# Patient Record
Sex: Female | Born: 1975 | Race: White | Hispanic: No | Marital: Married | Smoking: Never smoker
Health system: Southern US, Community
[De-identification: ages and names within clinical notes are randomized; demographics above are authoritative.]

## PROBLEM LIST (undated history)

## (undated) DIAGNOSIS — E611 Iron deficiency: Secondary | ICD-10-CM

## (undated) DIAGNOSIS — N920 Excessive and frequent menstruation with regular cycle: Secondary | ICD-10-CM

## (undated) DIAGNOSIS — R030 Elevated blood-pressure reading, without diagnosis of hypertension: Secondary | ICD-10-CM

## (undated) DIAGNOSIS — G4730 Sleep apnea, unspecified: Secondary | ICD-10-CM

## (undated) HISTORY — PX: TONSILLECTOMY: SHX5217

---

## 2003-06-16 ENCOUNTER — Other Ambulatory Visit: Admission: RE | Admit: 2003-06-16 | Discharge: 2003-06-16 | Payer: Self-pay | Admitting: Obstetrics and Gynecology

## 2003-11-24 ENCOUNTER — Other Ambulatory Visit: Admission: RE | Admit: 2003-11-24 | Discharge: 2003-11-24 | Payer: Self-pay | Admitting: Obstetrics and Gynecology

## 2004-09-04 ENCOUNTER — Emergency Department (HOSPITAL_COMMUNITY): Admission: EM | Admit: 2004-09-04 | Discharge: 2004-09-05 | Payer: Self-pay | Admitting: Emergency Medicine

## 2004-11-19 ENCOUNTER — Ambulatory Visit: Payer: Self-pay | Admitting: Family Medicine

## 2005-02-15 ENCOUNTER — Other Ambulatory Visit: Admission: RE | Admit: 2005-02-15 | Discharge: 2005-02-15 | Payer: Self-pay | Admitting: Obstetrics and Gynecology

## 2005-11-05 ENCOUNTER — Inpatient Hospital Stay (HOSPITAL_COMMUNITY): Admission: AD | Admit: 2005-11-05 | Discharge: 2005-11-08 | Payer: Self-pay | Admitting: Obstetrics and Gynecology

## 2005-12-12 ENCOUNTER — Other Ambulatory Visit: Admission: RE | Admit: 2005-12-12 | Discharge: 2005-12-12 | Payer: Self-pay | Admitting: Obstetrics and Gynecology

## 2007-07-08 ENCOUNTER — Encounter: Payer: Self-pay | Admitting: Family Medicine

## 2008-07-20 ENCOUNTER — Encounter: Payer: Self-pay | Admitting: Family Medicine

## 2008-07-21 LAB — HM PAP SMEAR

## 2008-08-25 ENCOUNTER — Ambulatory Visit: Payer: Self-pay | Admitting: Family Medicine

## 2008-08-26 LAB — CONVERTED CEMR LAB
CO2: 23 meq/L (ref 19–32)
Creatinine, Ser: 0.76 mg/dL (ref 0.40–1.20)
Glucose, Bld: 97 mg/dL (ref 70–99)
LDL Cholesterol: 99 mg/dL (ref 0–99)
Sodium: 138 meq/L (ref 135–145)
TSH: 3.919 microintl units/mL (ref 0.350–4.50)
Total CHOL/HDL Ratio: 3.6
Total Protein: 7.5 g/dL (ref 6.0–8.3)
Triglycerides: 80 mg/dL (ref <150)
VLDL: 16 mg/dL (ref 0–40)

## 2008-10-26 ENCOUNTER — Ambulatory Visit: Payer: Self-pay | Admitting: Family Medicine

## 2008-10-26 ENCOUNTER — Encounter: Admission: RE | Admit: 2008-10-26 | Discharge: 2008-10-26 | Payer: Self-pay | Admitting: Family Medicine

## 2008-11-14 ENCOUNTER — Telehealth: Payer: Self-pay | Admitting: Family Medicine

## 2008-11-14 ENCOUNTER — Ambulatory Visit: Payer: Self-pay | Admitting: Family Medicine

## 2008-11-18 ENCOUNTER — Encounter: Admission: RE | Admit: 2008-11-18 | Discharge: 2008-11-18 | Payer: Self-pay | Admitting: Family Medicine

## 2008-11-18 ENCOUNTER — Telehealth: Payer: Self-pay | Admitting: Family Medicine

## 2008-11-22 ENCOUNTER — Telehealth: Payer: Self-pay | Admitting: Family Medicine

## 2008-11-23 ENCOUNTER — Encounter: Payer: Self-pay | Admitting: Family Medicine

## 2008-11-28 ENCOUNTER — Ambulatory Visit: Payer: Self-pay | Admitting: Family Medicine

## 2008-11-28 DIAGNOSIS — J45990 Exercise induced bronchospasm: Secondary | ICD-10-CM

## 2009-04-26 ENCOUNTER — Telehealth: Payer: Self-pay | Admitting: Family Medicine

## 2009-07-28 ENCOUNTER — Encounter: Admission: RE | Admit: 2009-07-28 | Discharge: 2009-07-28 | Payer: Self-pay | Admitting: Obstetrics and Gynecology

## 2009-09-08 ENCOUNTER — Ambulatory Visit: Payer: Self-pay | Admitting: Family Medicine

## 2009-10-06 ENCOUNTER — Ambulatory Visit: Payer: Self-pay | Admitting: Family Medicine

## 2009-10-06 DIAGNOSIS — R635 Abnormal weight gain: Secondary | ICD-10-CM | POA: Insufficient documentation

## 2009-10-06 DIAGNOSIS — N920 Excessive and frequent menstruation with regular cycle: Secondary | ICD-10-CM

## 2009-10-10 LAB — CONVERTED CEMR LAB
Basophils Absolute: 0 K/uL
Basophils Relative: 0 %
Eosinophils Absolute: 0.1 K/uL
Eosinophils Relative: 2 %
Ferritin: 32 ng/mL
Free T4: 1.02 ng/dL
HCT: 40.4 %
Hemoglobin: 13.6 g/dL
Iron: 79 ug/dL
Lymphocytes Relative: 33 %
Lymphs Abs: 2.3 K/uL
MCHC: 33.7 g/dL
MCV: 90.8 fL
Monocytes Absolute: 0.6 K/uL
Monocytes Relative: 9 %
Neutro Abs: 4 K/uL
Neutrophils Relative %: 57 %
Platelets: 279 K/uL
RBC: 4.45 M/uL
RDW: 13.2 %
Saturation Ratios: 18 % — ABNORMAL LOW
T3, Free: 3.1 pg/mL
TIBC: 434 ug/dL
TSH: 1.371 u[IU]/mL
UIBC: 355 ug/dL
WBC: 7.1 10*3/microliter

## 2010-01-08 ENCOUNTER — Ambulatory Visit: Payer: Self-pay | Admitting: Family Medicine

## 2010-01-08 DIAGNOSIS — J00 Acute nasopharyngitis [common cold]: Secondary | ICD-10-CM | POA: Insufficient documentation

## 2010-01-08 LAB — CONVERTED CEMR LAB: Rapid Strep: NEGATIVE

## 2010-01-22 ENCOUNTER — Ambulatory Visit: Payer: Self-pay | Admitting: Family Medicine

## 2010-01-22 DIAGNOSIS — L738 Other specified follicular disorders: Secondary | ICD-10-CM

## 2010-11-20 NOTE — Assessment & Plan Note (Signed)
Summary: folliculitis   Vital Signs:  Patient profile:   35 year old female Height:      64 inches Weight:      221 pounds Pulse rate:   90 / minute BP sitting:   123 / 77  (left arm) Cuff size:   large  Vitals Entered By: Kathlene November (January 22, 2010 2:58 PM) CC: C-section scar to the right has knot did notice some pus drainage from site, Hypertension Management   Primary Care Provider:  Nani Gasser MD  CC:  C-section scar to the right has knot did notice some pus drainage from site and Hypertension Management.  History of Present Illness: C-section scar to the right has knot did notice some pus drainage from site. Noticed it 5 night ago and noticed a tender spot when got of of the shower. Noticed some yellow pus.  c/section scar from 10 years ago.  It is less swollen today.   Hypertension History:      Negative major cardiovascular risk factors include female age less than 52 years old, negative family history for ischemic heart disease, and non-tobacco-user status.     Current Medications (verified): 1)  Ventolin Hfa 108 (90 Base) Mcg/act Aers (Albuterol Sulfate) .... 2-4 Puffs Inhaled Every 4-6 Hours As Needed  Allergies (verified): No Known Drug Allergies  Comments:  Nurse/Medical Assistant: The patient's medications and allergies were reviewed with the patient and were updated in the Medication and Allergy Lists. Kathlene November (January 22, 2010 2:59 PM)  Physical Exam  General:  Well-developed,well-nourished,in no acute distress; alert,appropriate and cooperative throughout examination Skin:  Right above the right lateral edge of the c/sec scar ther is a pustule with a hair at the center. It is not particularly inflammed but there is some induration under the skin.    Impression & Recommendations:  Problem # 1:  FOLLICULITIS (ICD-704.8) Use antibacterial soap and keep clean. Warm compresses. Take ABS as rx.  If not resolving in one week then let me know and  will need to I & D the lesion.   Complete Medication List: 1)  Ventolin Hfa 108 (90 Base) Mcg/act Aers (Albuterol sulfate) .... 2-4 puffs inhaled every 4-6 hours as needed 2)  Sulfamethoxazole-tmp Ds 800-160 Mg Tabs (Sulfamethoxazole-trimethoprim) .... Take 1 tablet by mouth two times a day for 10 days  Hypertension Assessment/Plan:      The patient's hypertensive risk group is category A: No risk factors and no target organ damage.  Her calculated 10 year risk of coronary heart disease is 1 %.  Today's blood pressure is 123/77.   Prescriptions: SULFAMETHOXAZOLE-TMP DS 800-160 MG TABS (SULFAMETHOXAZOLE-TRIMETHOPRIM) Take 1 tablet by mouth two times a day for 10 days  #20 x 0   Entered and Authorized by:   Nani Gasser MD   Signed by:   Nani Gasser MD on 01/22/2010   Method used:   Electronically to        [facility] [phone]* (retail)       [address]       [place], [place]  [address]       Ph: [phone] or [phone]       Fax: [phone]   RxID:   [id number]

## 2010-11-20 NOTE — Assessment & Plan Note (Signed)
Summary: Pharyngitis   Vital Signs:  Patient profile:   35 year old female Height:      64 inches Weight:      214 pounds O2 Sat:      98 % on Room air Temp:     98.4 degrees F oral Pulse rate:   79 / minute BP sitting:   129 / 83  (left arm) Cuff size:   large  Vitals Entered By: Kathlene November (January 08, 2010 12:56 PM)  O2 Flow:  Room air CC: pain in throat since Friday- today started a cough and runny nose   Primary Care Provider:  Nani Gasser MD  CC:  pain in throat since Friday- today started a cough and runny nose.  History of Present Illness: Jordan Hickman is a 35 year old female presenting with sore throat, cough, and runny nose/congestion x4 days. On Friday afternoon she began having a sore throat which has continued on and off since then, worse in the mornings. Today she has had dry cough and runny nose/nasal congestion. No fevers, chills, sweats. Sick contacts include her daughter who had a recent stomach bug. Pt had diarrhea for a week and a half, which she says she always has with her period, which resolved yesterday. She had nausea but no vomiting. She has not tried any OTC medications.   Current Medications (verified): 1)  Ventolin Hfa 108 (90 Base) Mcg/act Aers (Albuterol Sulfate) .... 2-4 Puffs Inhaled Every 4-6 Hours As Needed  Allergies (verified): No Known Drug Allergies  Comments:  Nurse/Medical Assistant: The patient's medications and allergies were reviewed with the patient and were updated in the Medication and Allergy Lists. Kathlene November (January 08, 2010 12:56 PM)  Physical Exam  General:  Pleasant overweight-appearing female in no acute distress.  Head:  Normocephalic and atraumatic.  Eyes:  Sclera clear with no corneal or conjunctival inflammation noted. Ears:  External ear exam shows no significant lesions or deformities.  Otoscopic examination reveals clear canals, tympanic membranes are intact bilaterally without bulging, retraction,  inflammation or discharge. Hearing is grossly normal bilaterally. Nose:  External nasal examination shows no deformity or inflammation.  Mouth:  Oropharynx injected with no exudate. Moist mucus membranes.  Neck:  Right anterior cervical lymph node  ~1cm in diameter. Nontender. Neck supple.  Lungs:  Normal respiratory effort, chest expands symmetrically. Lungs are clear to auscultation, no crackles or wheezes. Heart:  Normal rate and regular rhythm. S1 and S2 normal without gallop, murmur, click, rub or other extra sounds. Pulses:  2+ radial pulses bilaterally.  Skin:  no rashes.   Psych:  Interacting appropriately with normal concentration and attention.    Impression & Recommendations:  Problem # 1:  ACUTE NASOPHARYNGITIS (ICD-460)  Acute viral illness. Rapid strep negative. Suggested OTC strategies for symptomatic management including saltwater gargle. Follow up if symptoms worsen over next 7-10 days.   Orders: Rapid Strep ([address])  Complete Medication List: 1)  Ventolin Hfa 108 (90 Base) Mcg/act Aers (Albuterol sulfate) .... 2-4 puffs inhaled every 4-6 hours as needed  Laboratory Results  Date/Time Received: 01/08/2010 Date/Time Reported: 01/08/2010  Other Tests  Rapid Strep: negative

## 2011-02-18 ENCOUNTER — Ambulatory Visit (INDEPENDENT_AMBULATORY_CARE_PROVIDER_SITE_OTHER): Payer: 59 | Admitting: Family Medicine

## 2011-02-18 ENCOUNTER — Encounter: Payer: Self-pay | Admitting: Family Medicine

## 2011-02-18 VITALS — BP 138/85 | HR 85 | Ht 64.0 in | Wt 229.0 lb

## 2011-02-18 DIAGNOSIS — M79673 Pain in unspecified foot: Secondary | ICD-10-CM

## 2011-02-18 DIAGNOSIS — M79609 Pain in unspecified limb: Secondary | ICD-10-CM

## 2011-02-18 DIAGNOSIS — M7989 Other specified soft tissue disorders: Secondary | ICD-10-CM

## 2011-02-18 MED ORDER — CYCLOBENZAPRINE HCL 10 MG PO TABS: 10.0000 mg | ORAL_TABLET | Freq: Every evening | ORAL | Status: AC | PRN

## 2011-02-18 NOTE — Progress Notes (Signed)
  Subjective:    Patient ID: Jordan Hickman, female    DOB: [birth year], 34 y.o.   MRN: [id number]  HPI Heels have been red for about a week. Last night was getting cramping in her left heel almost every hour. She barely slept last night. Tried massaging the area nd her foot. The spasm would last for about 2 minutes.  Feel have been swollen.  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  Heels have been sore to stand on them for about the last 2 weeks.     Review of Systems     Objective:   Physical Exam  Constitutional: She appears well-developed and well-nourished.  Musculoskeletal:       Bilat heels pad are erythematous and swollen. No breaks in the skin. Some dry thickened skin. DP pulse 2+ bilat.            Assessment & Plan:  Heel fat pad contusions - Will refer to podiatry. Rest for a couple of days. In the meantime trial of NSAID for pain relief.  Will check potassium level to make sure potassium is normal. Trial of muscle relaxer at bedtime.

## 2011-02-18 NOTE — Patient Instructions (Signed)
We will call you with the Podiatry referral.  Try aleve or Ibuprofen for one week.  We will call you with the lab results.

## 2011-02-19 ENCOUNTER — Telehealth: Payer: Self-pay | Admitting: Family Medicine

## 2011-02-19 LAB — BASIC METABOLIC PANEL WITH GFR
CO2: 25 mEq/L (ref 19–32)
Creat: 0.73 mg/dL (ref 0.40–1.20)
GFR, Est African American: >60 mL/min (ref >60)
GFR, Est Non African American: >60 mL/min (ref >60)
Glucose, Bld: 105 mg/dL — ABNORMAL HIGH (ref 70–99)
Potassium: 4.3 mEq/L (ref 3.5–5.3)

## 2011-02-19 NOTE — Telephone Encounter (Signed)
Pt.notified

## 2011-02-19 NOTE — Telephone Encounter (Signed)
Call pt: labs look great.

## 2011-02-26 ENCOUNTER — Telehealth: Payer: Self-pay | Admitting: *Deleted

## 2011-02-27 NOTE — Telephone Encounter (Signed)
x

## 2011-07-29 ENCOUNTER — Ambulatory Visit (INDEPENDENT_AMBULATORY_CARE_PROVIDER_SITE_OTHER): Payer: 59 | Admitting: Family Medicine

## 2011-07-29 ENCOUNTER — Encounter: Payer: Self-pay | Admitting: Family Medicine

## 2011-07-29 VITALS — BP 125/88 | HR 79 | Temp 98.2°F | Wt 216.0 lb

## 2011-07-29 DIAGNOSIS — J329 Chronic sinusitis, unspecified: Secondary | ICD-10-CM

## 2011-07-29 DIAGNOSIS — R0609 Other forms of dyspnea: Secondary | ICD-10-CM

## 2011-07-29 DIAGNOSIS — J45909 Unspecified asthma, uncomplicated: Secondary | ICD-10-CM

## 2011-07-29 DIAGNOSIS — R0989 Other specified symptoms and signs involving the circulatory and respiratory systems: Secondary | ICD-10-CM

## 2011-07-29 DIAGNOSIS — R0683 Snoring: Secondary | ICD-10-CM

## 2011-07-29 MED ORDER — AMOXICILLIN-POT CLAVULANATE 875-125 MG PO TABS: 1.0000 | ORAL_TABLET | Freq: Two times a day (BID) | ORAL | Status: AC

## 2011-07-29 MED ORDER — ALBUTEROL SULFATE HFA 108 (90 BASE) MCG/ACT IN AERS: 2.0000 | INHALATION_SPRAY | Freq: Four times a day (QID) | RESPIRATORY_TRACT | Status: DC | PRN

## 2011-07-29 NOTE — Progress Notes (Signed)
  Subjective:    Patient ID: Jordan Hickman, female    DOB: 18-Dec-1975, 35 y.o.   MRN: [id number]  HPI 2 weeks of sever nasal congestion. Some cough. Chest pressure.  bilat maxillary sinus pressure. + HA.  No fever.  Last hgb 11.2. No GI sxs.  Dec appetite.  Taking mucinex DM and sudafed. Hasn't really been using her inhaler. Denies wheezing but does feel like someone is kneeling on her chest.  No SOB  Husband says hold breath at night when sleeps. Has been worse while sick but has been dong this for years. She is overweight.  No snoring per se but husand says she sounds like darth vader and will gasp in her sleep at times.  C/o fatigue. Father with hx of OSA but he is morbidly obese.    Review of Systems     Objective:   Physical Exam  Constitutional: She is oriented to person, place, and time. She appears well-developed and well-nourished.  HENT:  Head: Normocephalic and atraumatic.  Right Ear: External ear normal.  Left Ear: External ear normal.  Nose: Nose normal.  Mouth/Throat: Oropharynx is clear and moist.       TMs and canals are clear.   Eyes: Conjunctivae and EOM are normal. Pupils are equal, round, and reactive to light.  Neck: Neck supple. No thyromegaly present.  Cardiovascular: Normal rate, regular rhythm and normal heart sounds.   Pulmonary/Chest: Effort normal and breath sounds normal. She has no wheezes.  Lymphadenopathy:    She has no cervical adenopathy.  Neurological: She is alert and oriented to person, place, and time.  Skin: Skin is warm and dry.  Psychiatric: She has a normal mood and affect.          Assessment & Plan:  Sinusitis x 2 weeks - Tx with ABX. Call if not better in one week Can continue OTC meds if they provide some relief.   Asthma - Peak flow in the yellow just barely by about 15 points.  Encouraged her to start using her inhaler a couple of times of days and call if gets SOB or wheezing.   Snoring/breath holding at night - Will refer  for sleep study. Will call with referral.

## 2011-07-31 ENCOUNTER — Other Ambulatory Visit: Payer: Self-pay | Admitting: Family Medicine

## 2011-07-31 NOTE — Telephone Encounter (Signed)
Pt calling for refill of her albuterol MDI. Plan:  Reviwed pt chart and the medication was sent electronically on 07-29-11. Jarvis Newcomer, LPN [facility]

## 2011-08-13 ENCOUNTER — Ambulatory Visit: Payer: 59 | Attending: Orthopedic Surgery | Admitting: Physical Therapy

## 2011-08-13 DIAGNOSIS — M25673 Stiffness of unspecified ankle, not elsewhere classified: Secondary | ICD-10-CM | POA: Insufficient documentation

## 2011-08-13 DIAGNOSIS — M2550 Pain in unspecified joint: Secondary | ICD-10-CM | POA: Insufficient documentation

## 2011-08-13 DIAGNOSIS — M25676 Stiffness of unspecified foot, not elsewhere classified: Secondary | ICD-10-CM | POA: Insufficient documentation

## 2011-08-13 DIAGNOSIS — M6281 Muscle weakness (generalized): Secondary | ICD-10-CM | POA: Insufficient documentation

## 2011-08-13 DIAGNOSIS — R262 Difficulty in walking, not elsewhere classified: Secondary | ICD-10-CM | POA: Insufficient documentation

## 2011-08-13 DIAGNOSIS — IMO0001 Reserved for inherently not codable concepts without codable children: Secondary | ICD-10-CM | POA: Insufficient documentation

## 2011-08-16 ENCOUNTER — Ambulatory Visit: Payer: 59 | Admitting: Physical Therapy

## 2011-08-20 ENCOUNTER — Telehealth: Payer: Self-pay | Admitting: Family Medicine

## 2011-08-20 ENCOUNTER — Encounter: Payer: Self-pay | Admitting: Family Medicine

## 2011-08-20 DIAGNOSIS — G4730 Sleep apnea, unspecified: Secondary | ICD-10-CM | POA: Insufficient documentation

## 2011-08-20 NOTE — Telephone Encounter (Signed)
Call pt: sleep study was + for mild sleep apnea. Dr. Marnette Burgess office ( the doctor who read the sleep study) will  Contact you  To discuss further the results and the next step.

## 2011-08-21 NOTE — Telephone Encounter (Signed)
Pt aware.

## 2011-08-22 ENCOUNTER — Ambulatory Visit: Payer: 59 | Attending: Orthopedic Surgery | Admitting: Physical Therapy

## 2011-08-22 DIAGNOSIS — M25673 Stiffness of unspecified ankle, not elsewhere classified: Secondary | ICD-10-CM | POA: Insufficient documentation

## 2011-08-22 DIAGNOSIS — M25676 Stiffness of unspecified foot, not elsewhere classified: Secondary | ICD-10-CM | POA: Insufficient documentation

## 2011-08-22 DIAGNOSIS — M6281 Muscle weakness (generalized): Secondary | ICD-10-CM | POA: Insufficient documentation

## 2011-08-22 DIAGNOSIS — M2550 Pain in unspecified joint: Secondary | ICD-10-CM | POA: Insufficient documentation

## 2011-08-22 DIAGNOSIS — IMO0001 Reserved for inherently not codable concepts without codable children: Secondary | ICD-10-CM | POA: Insufficient documentation

## 2011-08-22 DIAGNOSIS — R262 Difficulty in walking, not elsewhere classified: Secondary | ICD-10-CM | POA: Insufficient documentation

## 2011-08-26 ENCOUNTER — Ambulatory Visit: Payer: 59 | Admitting: Physical Therapy

## 2011-08-30 ENCOUNTER — Ambulatory Visit: Payer: 59 | Admitting: Physical Therapy

## 2011-09-05 ENCOUNTER — Ambulatory Visit: Payer: 59 | Admitting: Physical Therapy

## 2011-09-10 ENCOUNTER — Ambulatory Visit: Payer: 59 | Admitting: Physical Therapy

## 2011-09-10 ENCOUNTER — Encounter: Payer: Self-pay | Admitting: Family Medicine

## 2011-09-19 ENCOUNTER — Ambulatory Visit: Payer: 59 | Admitting: Physical Therapy

## 2011-09-23 ENCOUNTER — Ambulatory Visit: Payer: 59 | Attending: Orthopedic Surgery | Admitting: Physical Therapy

## 2011-09-23 DIAGNOSIS — M6281 Muscle weakness (generalized): Secondary | ICD-10-CM | POA: Insufficient documentation

## 2011-09-23 DIAGNOSIS — IMO0001 Reserved for inherently not codable concepts without codable children: Secondary | ICD-10-CM | POA: Insufficient documentation

## 2011-09-23 DIAGNOSIS — M2550 Pain in unspecified joint: Secondary | ICD-10-CM | POA: Insufficient documentation

## 2011-09-23 DIAGNOSIS — M25676 Stiffness of unspecified foot, not elsewhere classified: Secondary | ICD-10-CM | POA: Insufficient documentation

## 2011-09-23 DIAGNOSIS — R262 Difficulty in walking, not elsewhere classified: Secondary | ICD-10-CM | POA: Insufficient documentation

## 2011-09-23 DIAGNOSIS — M25673 Stiffness of unspecified ankle, not elsewhere classified: Secondary | ICD-10-CM | POA: Insufficient documentation

## 2011-09-27 ENCOUNTER — Ambulatory Visit: Payer: 59 | Admitting: Physical Therapy

## 2011-09-30 ENCOUNTER — Ambulatory Visit: Payer: 59 | Admitting: Physical Therapy

## 2011-10-04 ENCOUNTER — Ambulatory Visit: Payer: 59 | Admitting: Physical Therapy

## 2011-10-04 ENCOUNTER — Encounter: Payer: Self-pay | Admitting: Family Medicine

## 2011-12-23 ENCOUNTER — Encounter: Payer: Self-pay | Admitting: Family Medicine

## 2011-12-23 ENCOUNTER — Ambulatory Visit (INDEPENDENT_AMBULATORY_CARE_PROVIDER_SITE_OTHER): Payer: 59 | Admitting: Family Medicine

## 2011-12-23 DIAGNOSIS — H579 Unspecified disorder of eye and adnexa: Secondary | ICD-10-CM

## 2011-12-23 DIAGNOSIS — L259 Unspecified contact dermatitis, unspecified cause: Secondary | ICD-10-CM

## 2011-12-23 MED ORDER — OLOPATADINE HCL 0.2 % OP SOLN: 1.0000 [drp] | Freq: Every day | OPHTHALMIC | Status: DC

## 2011-12-23 NOTE — Progress Notes (Signed)
  Subjective:    Patient ID: Jordan Hickman, female    DOB: 1975/12/05, 36 y.o.   MRN: [id number]  HPI Lower lids have been swollen since 11/22/11. Says has been watering and then feeling ithcy. Also notices some redness and scaling too.  Notice some scabbing and dryness on the corners.  Started using a vitamin E oil and that helped but rash came back after stopped using.  Same makeup.  Gets new contacts.  She tried buying new makesup and changed her contact solution. Today wearinfg glases.  No fever. No other new symptoms except for runny nose, but this is likely secondary to her watery eyes. She says she's also been using Neosporin and some on the area as well. He does seem to help.   Review of Systems     Objective:   Physical Exam  Constitutional: She appears well-developed and well-nourished.  HENT:  Head: Normocephalic.  Eyes: Pupils are equal, round, and reactive to light.       Scleral injection.  Lower lids are swollen. More swollen left eye.  Clear drianage.            Assessment & Plan:  Contact dermatitis-I really suspect that she is having some type of contact dermatitis either from her makeup work from the solution that she put her contacts in. Even though she has not changed her makeup and it even bought new makeup, but the same brand, I still think that this could be triggering her symptoms. I asked her to try not to wear makeup for a week and see if this improves. Also asked her to followup with her eye doctor which she plans to see in about a month anyway for new contacts. She might want to check on the website for the company that she gets her contacts from, to make sure there have not been any recalls et Karie Soda. In the meantime I'll treat her with Pataday. Which should certainly help her is the irritation, itching and inflammation. She can also add an over-the-counter antihistamine such as Claritin, Allegra or Zyrtec. If she could tyr not to wear her contacts for a week that  may help as welll. She says her glasses are too uncomfortable to wear for more than a couple of days at a time. I also encouraged her to avoid rubbing and scratching at her eyes as this will only irritate the inflammation and potentially lead to a bacterial infection.I did encourage her to stop the Neosporin as this can turn into a contact dermatitis with prolonged use.

## 2011-12-23 NOTE — Patient Instructions (Signed)
Skip the neosporin.   Try the drops.  Get in with your eye doctor.

## 2012-06-02 ENCOUNTER — Emergency Department
Admission: EM | Admit: 2012-06-02 | Discharge: 2012-06-02 | Disposition: A | Discharge status: Home / Self Care | Payer: 59 | Source: Home / Self Care | Attending: Family Medicine | Admitting: Family Medicine

## 2012-06-02 ENCOUNTER — Encounter: Payer: Self-pay | Admitting: Emergency Medicine

## 2012-06-02 DIAGNOSIS — J069 Acute upper respiratory infection, unspecified: Secondary | ICD-10-CM

## 2012-06-02 DIAGNOSIS — M940 Chondrocostal junction syndrome [Tietze]: Secondary | ICD-10-CM

## 2012-06-02 MED ORDER — BENZONATATE 200 MG PO CAPS: 200.0000 mg | ORAL_CAPSULE | Freq: Every day | ORAL | Status: AC

## 2012-06-02 MED ORDER — AZITHROMYCIN 250 MG PO TABS: ORAL_TABLET | ORAL | Status: AC

## 2012-06-02 NOTE — ED Notes (Signed)
Fatigue, congestion, headache, cough with hoarseness; some shortness of breath and 'heavy' sensation in chest x 7 days. Daughter is here this evening with similar symptoms.

## 2012-06-02 NOTE — ED Provider Notes (Signed)
History     CSN: [id number]  Arrival date & time 06/02/12  4098   First MD Initiated Contact with Patient 06/02/12 1918      Chief Complaint  Patient presents with  . Cough  . Headache  . Fatigue      HPI Comments: Fatigue, congestion, headache, cough with hoarseness; some shortness of breath and 'heavy' sensation in chest x 7 days. Daughter is here this evening with similar symptoms.   Patient has history of asthma but denies shortness of breath or wheezing.  She has a sensation of tightness and restriction in her anterior chest with deep inspiration.  She has tried using her albuterol inhaler without improvement. She has had four episodes of pneumonia in the past.   The history is provided by the patient.    Past Medical History  Diagnosis Date  . Asthma     exercise induced    Past Surgical History  Procedure Date  . Cesarean section 12-21-99  . Tonsillectomy 1990's    Family History  Problem Relation Age of Onset  . Diabetes Father   . Hypertension Father   . Other Daughter     Leomyosarcoma- right bicep- in remission  . Hypertension Mother   . Thyroid disease Mother   . Stroke Other   . Sleep apnea Father     morbidly obese.     History  Substance Use Topics  . Smoking status: Never Smoker   . Smokeless tobacco: Not on file  . Alcohol Use: Yes    OB History    Grav Para Term Preterm Abortions TAB SAB Ect Mult Living                  Review of Systems No sore throat at present + cough No pleuritic pain, but complains of tightness in anterior chest with inspiration No wheezing + nasal congestion + post-nasal drainage + hoarseness No sinus pain/pressure No itchy/red eyes No earache No hemoptysis No SOB No fever/chills No nausea No vomiting No abdominal pain No diarrhea No urinary symptoms No skin rashes + fatigue No myalgias + headache Used OTC meds without relief  Allergies  Review of patient's allergies indicates no known  allergies.  Home Medications   Current Outpatient Rx  Name Route Sig Dispense Refill  . ALBUTEROL SULFATE HFA 108 (90 BASE) MCG/ACT IN AERS Inhalation Inhale 2 puffs into the lungs every 6 (six) hours as needed for wheezing. 2-4 puffs inhaled every 4-6 hours as needed 1 Inhaler 1  . AZITHROMYCIN 250 MG PO TABS  Take 2 tabs today; then begin one tab once daily for 4 more days (Rx void after 06/10/12) 6 each 0  . BENZONATATE 200 MG PO CAPS Oral Take 1 capsule (200 mg total) by mouth at bedtime. Take as needed for cough 12 capsule 0  . OLOPATADINE HCL 0.2 % OP SOLN Ophthalmic Apply 1 drop to eye daily. 2.5 mL 0    BP 124/86  Pulse 76  Temp 98.3 F (36.8 C) (Oral)  Resp 16  Ht 5\' 3"  (1.6 m)  Wt 220 lb (99.791 kg)  BMI 38.97 kg/m2  SpO2 99%  LMP 05/27/2012  Physical Exam Nursing notes and Vital Signs reviewed. Appearance:  Patient appears stated age, and in no acute distress.  Patient is obese (BMI 39) Eyes:  Pupils are equal, round, and reactive to light and accomodation.  Extraocular movement is intact.  Conjunctivae are not inflamed  Ears:  Canals normal.  Tympanic  membranes normal.  Nose:  Mildly congested turbinates.  No sinus tenderness.   Pharynx:  Normal Neck:  Supple.  Tender shotty posterior nodes are palpated primarily on the left. Lungs:  Clear to auscultation.  Breath sounds are equal.  Chest:  Distinct tenderness to palpation over the mid-sternum.  Heart:  Regular rate and rhythm without murmurs, rubs, or gallops.  Abdomen:  Nontender without masses or hepatosplenomegaly.  Bowel sounds are present.  No CVA or flank tenderness.  Extremities:  No edema.  No calf tenderness Skin:  No rash present.   ED Course  Procedures  none      1. Acute upper respiratory infections of unspecified site   2. Costochondritis, acute       MDM  There is no evidence of bacterial infection today.   Treat symptomatically for now: Prescription written for Benzonatate [facility]) to  take at bedtime for night-time cough.  Take plain Mucinex (guaifenesin) twice daily for cough and congestion.  Increase fluid intake, rest. May use Afrin nasal spray (or generic oxymetazoline) twice daily for about 5 days.  Also recommend using saline nasal spray several times daily and saline nasal irrigation (AYR is a common brand) Stop all antihistamines for now, and other non-prescription cough/cold preparations. May continue albuterol inhaler as needed. May take Ibuprofen 200mg , 4 tabs every 8 hours with food for chest/sternum discomfort. Begin Azithromycin if not improving about 5 days or if persistent fever develops (Given a prescription to hold, with an expiration date)  Follow-up with family doctor if not improving about 5 days.        Lattie Haw, MD 06/03/12 [phone]

## 2012-12-14 ENCOUNTER — Encounter: Payer: Self-pay | Admitting: Family Medicine

## 2012-12-14 ENCOUNTER — Ambulatory Visit (INDEPENDENT_AMBULATORY_CARE_PROVIDER_SITE_OTHER): Payer: 59 | Admitting: Family Medicine

## 2012-12-14 VITALS — BP 137/85 | HR 77 | Ht 63.0 in | Wt 230.0 lb

## 2012-12-14 DIAGNOSIS — J0100 Acute maxillary sinusitis, unspecified: Secondary | ICD-10-CM

## 2012-12-14 MED ORDER — FLUTICASONE PROPIONATE 50 MCG/ACT NA SUSP: 2.0000 | Freq: Every day | NASAL | Status: DC

## 2012-12-14 MED ORDER — AMOXICILLIN-POT CLAVULANATE 875-125 MG PO TABS: 1.0000 | ORAL_TABLET | Freq: Two times a day (BID) | ORAL | Status: DC

## 2012-12-14 NOTE — Progress Notes (Signed)
  Subjective:    Patient ID: Jordan Hickman, female    DOB: Dec 08, 1975, 37 y.o.   MRN: [id number]  HPI Sinusitis - hasn't felt well since christmas she has tried several OTC meds to help and they work for a while and then the sxs come back she is also having that she is having problems with her ears feeling stopped up.  Bilateral nasal pain and pressure. Says she is really fatigued.  No fever.  No ear pain. Feels like her ears have losts pressure. Says feels like a balloon in blown up in her head.   +HA , midl to moderate. + nasal congestin, + rrhinnorrhea, + dizzy.    Review of Systems     Objective:   Physical Exam  Constitutional: She is oriented to person, place, and time. She appears well-developed and well-nourished.  HENT:  Head: Normocephalic and atraumatic.  Right Ear: External ear normal.  Left Ear: External ear normal.  Nose: Nose normal.  Mouth/Throat: Oropharynx is clear and moist.  TMs and canals are clear.   Eyes: Conjunctivae and EOM are normal. Pupils are equal, round, and reactive to light.  Neck: Neck supple. No thyromegaly present.  Cardiovascular: Normal rate, regular rhythm and normal heart sounds.   Pulmonary/Chest: Effort normal and breath sounds normal. She has no wheezes.  Lymphadenopathy:    She has no cervical adenopathy.  Neurological: She is alert and oriented to person, place, and time.  Skin: Skin is warm and dry.  Psychiatric: She has a normal mood and affect.          Assessment & Plan:  Sinusitis - Will tx augmentin and add a nasal steroid spray.  Call if not better in 10 days.

## 2014-07-06 ENCOUNTER — Ambulatory Visit (INDEPENDENT_AMBULATORY_CARE_PROVIDER_SITE_OTHER): Payer: 59 | Admitting: Family Medicine

## 2014-07-06 ENCOUNTER — Encounter: Payer: Self-pay | Admitting: Family Medicine

## 2014-07-06 VITALS — BP 152/92 | HR 93 | Temp 98.1°F | Wt 235.0 lb

## 2014-07-06 DIAGNOSIS — B9689 Other specified bacterial agents as the cause of diseases classified elsewhere: Secondary | ICD-10-CM

## 2014-07-06 DIAGNOSIS — J45990 Exercise induced bronchospasm: Secondary | ICD-10-CM

## 2014-07-06 DIAGNOSIS — J329 Chronic sinusitis, unspecified: Secondary | ICD-10-CM

## 2014-07-06 DIAGNOSIS — A499 Bacterial infection, unspecified: Secondary | ICD-10-CM

## 2014-07-06 MED ORDER — DOXYCYCLINE HYCLATE 100 MG PO TABS: ORAL_TABLET | ORAL | Status: AC

## 2014-07-06 MED ORDER — ALBUTEROL SULFATE HFA 108 (90 BASE) MCG/ACT IN AERS: 2.0000 | INHALATION_SPRAY | Freq: Four times a day (QID) | RESPIRATORY_TRACT | Status: DC | PRN

## 2014-07-06 NOTE — Progress Notes (Signed)
CC: Jordan Hickman is a 38 y.o. female is here for Nasal Congestion and chest congestion   Subjective: HPI:  Patient went to facial pressure localized in both eyes accompanied by postnasal drip, nonproductive cough, thick nasal discharge. Symptoms have been present for 8 days worsening on a daily basis. Symptoms are worse when lying down at night or at the end of her shift. Symptoms have slightly improved with NyQuil but caused intolerable  Drowsiness. No benefit from Flonase or Mucinex. Symptoms are moderate in severity present all hours of the day. Denies shortness of breath, fevers, chills, dysphagia, chest pain.  No wheezing at rest however since she's been sick she's having mild to moderate wheezing with any exertion beyond walking. 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 She's been using albuterol only once a day because she is running out of it.   Review Of Systems Outlined In HPI  Past Medical History  Diagnosis Date  . Asthma     exercise induced    Past Surgical History  Procedure Laterality Date  . Cesarean section  12-21-99  . Tonsillectomy  1990's   Family History  Problem Relation Age of Onset  . Diabetes Father   . Hypertension Father   . Other Daughter     Leomyosarcoma- right bicep- in remission  . Hypertension Mother   . Thyroid disease Mother   . Stroke Other   . Sleep apnea Father     morbidly obese.     History   Social History  . Marital Status: Married    Spouse Name: N/A    Number of Children: N/A  . Years of Education: N/A   Occupational History  . Not on file.   Social History Main Topics  . Smoking status: Never Smoker   . Smokeless tobacco: Not on file  . Alcohol Use: Yes  . Drug Use: No  . Sexual Activity:    Other Topics Concern  . Not on file   Social History Narrative  . No narrative on file     Objective: BP 152/92  Pulse 93  Temp(Src) 98.1 F (36.7 C) (Oral)  Wt 235 lb (106.595  kg)  SpO2 96%  General: Alert and Oriented, No Acute Distress HEENT: Pupils equal, round, reactive to light. Conjunctivae clear.  External ears unremarkable, canals clear with intact TMs with appropriate landmarks.  Middle ear appears open without effusion. Boggy inferior turbinates with moderate mucoid discharge.  Moist mucous membranes, pharynx without inflammation nor lesions however moderate cobblestoning.  Neck supple without palpable lymphadenopathy nor abnormal masses. Lungs: Clear to auscultation bilaterally, no wheezing/ronchi/rales.  Comfortable work of breathing. Good air movement. Extremities: No peripheral edema.  Strong peripheral pulses.  Mental Status: No depression, anxiety, nor agitation. Skin: Warm and dry.  Assessment & Plan: Jordan Hickman was seen today for nasal congestion and chest congestion.  Diagnoses and associated orders for this visit:  Bacterial sinusitis - doxycycline (VIBRA-TABS) 100 MG tablet; One by mouth twice a day for ten days.  Mild exercise-induced asthma - albuterol (VENTOLIN HFA) 108 (90 BASE) MCG/ACT inhaler; Inhale 2 puffs into the lungs every 6 (six) hours as needed for wheezing. 2-4 puffs inhaled every 4-6 hours as needed    Bacterial sinusitis: Start doxycycline consider Alka-Seltzer cold and sinus, nasal saline washes. Exercise-induced asthma: Mild exacerbation due to the above illness, scheduled albuterol every 4-6 hours for the next 48 hours then as needed. Refills provided  Return if symptoms  worsen or fail to improve.

## 2014-09-08 ENCOUNTER — Ambulatory Visit: Payer: 59 | Admitting: Sports Medicine

## 2016-01-01 ENCOUNTER — Encounter: Payer: Self-pay | Admitting: Family Medicine

## 2016-01-01 ENCOUNTER — Ambulatory Visit (INDEPENDENT_AMBULATORY_CARE_PROVIDER_SITE_OTHER): Payer: 59 | Admitting: Family Medicine

## 2016-01-01 VITALS — BP 130/87 | HR 90 | Temp 98.4°F | Wt 229.0 lb

## 2016-01-01 DIAGNOSIS — B372 Candidiasis of skin and nail: Secondary | ICD-10-CM

## 2016-01-01 DIAGNOSIS — J45990 Exercise induced bronchospasm: Secondary | ICD-10-CM

## 2016-01-01 DIAGNOSIS — J209 Acute bronchitis, unspecified: Secondary | ICD-10-CM | POA: Diagnosis not present

## 2016-01-01 MED ORDER — PREDNISONE 20 MG PO TABS: 40.0000 mg | ORAL_TABLET | Freq: Every day | ORAL | Status: DC

## 2016-01-01 MED ORDER — ALBUTEROL SULFATE HFA 108 (90 BASE) MCG/ACT IN AERS: 2.0000 | INHALATION_SPRAY | Freq: Four times a day (QID) | RESPIRATORY_TRACT | Status: DC | PRN

## 2016-01-01 MED ORDER — NYSTATIN-TRIAMCINOLONE 100000-0.1 UNIT/GM-% EX CREA: 1.0000 "application " | TOPICAL_CREAM | Freq: Two times a day (BID) | CUTANEOUS | Status: AC

## 2016-01-01 NOTE — Progress Notes (Signed)
Subjective:    Patient ID: Jordan Hickman, female    DOB: Nov 14, 1975, 40 y.o.   MRN: [id number]  HPI 5 days of Laryngitis and cough. No significant nasal pressure or congestion. She has had a runny nose in fact the edge of her nose feels raw and tender. Her cough has been persistent. She's been using NyQuil at night and that does seem to help. She's been using Mucinex DM during the daytime and that seems to help as well. She has not run a fever. Some intermittent sore throat. No GI symptoms.  Rash around her left breast. Has used a topical nystatin/triamcinolone in the past.  Her OB/GYN has rx this years ago.    Review of Systems   BP 130/87 mmHg  Pulse 90  Temp(Src) 98.4 F (36.9 C) (Oral)  Wt 229 lb (103.874 kg)  SpO2 99%    No Known Allergies  Past Medical History  Diagnosis Date  . Asthma     exercise induced    Past Surgical History  Procedure Laterality Date  . Cesarean section  12-21-99  . Tonsillectomy  1990's    Social History   Social History  . Marital Status: Married    Spouse Name: N/A  . Number of Children: N/A  . Years of Education: N/A   Occupational History  . Not on file.   Social History Main Topics  . Smoking status: Never Smoker   . Smokeless tobacco: Not on file  . Alcohol Use: Yes  . Drug Use: No  . Sexual Activity: Not on file   Other Topics Concern  . Not on file   Social History Narrative    Family History  Problem Relation Age of Onset  . Diabetes Father   . Hypertension Father   . Other Daughter     Leomyosarcoma- right bicep- in remission  . Hypertension Mother   . Thyroid disease Mother   . Stroke Other   . Sleep apnea Father     morbidly obese.     Outpatient Encounter Prescriptions as of 01/01/2016  Medication Sig  . albuterol (VENTOLIN HFA) 108 (90 Base) MCG/ACT inhaler Inhale 2 puffs into the lungs every 6 (six) hours as needed for wheezing. 2-4 puffs inhaled every 4-6 hours as needed  . fluticasone (FLONASE)  50 MCG/ACT nasal spray Place 2 sprays into the nose daily.  Marland Kitchen. nystatin-triamcinolone (MYCOLOG II) cream Apply 1 application topically 2 (two) times daily.  . predniSONE (DELTASONE) 20 MG tablet Take 2 tablets (40 mg total) by mouth daily.  . [DISCONTINUED] albuterol (VENTOLIN HFA) 108 (90 BASE) MCG/ACT inhaler Inhale 2 puffs into the lungs every 6 (six) hours as needed for wheezing. 2-4 puffs inhaled every 4-6 hours as needed   No facility-administered encounter medications on file as of 01/01/2016.          Objective:   Physical Exam  Constitutional: She is oriented to person, place, and time. She appears well-developed and well-nourished.  HENT:  Head: Normocephalic and atraumatic.  Right Ear: External ear normal.  Left Ear: External ear normal.  Nose: Nose normal.  Mouth/Throat: Oropharynx is clear and moist.  TMs and canals are clear.   Eyes: Conjunctivae and EOM are normal. Pupils are equal, round, and reactive to light.  Neck: Neck supple. No thyromegaly present.  Cardiovascular: Normal rate, regular rhythm and normal heart sounds.   Pulmonary/Chest: Effort normal and breath sounds normal. She has no wheezes.  Lymphadenopathy:    She has  no cervical adenopathy.  Neurological: She is alert and oriented to person, place, and time.  Skin: Skin is warm and dry.  She does have an erythematous slightly maculopapular rash in between both breasts and especially towards the left side going underneath the breasts.  Psychiatric: She has a normal mood and affect.          Assessment & Plan:  Acute bronchitis-expanded the statically viral and can last 10-14 days. Continue with current regimen. Did offer to give her perception for prednisone if she starts to feel like she is getting more chest tightness like she had on Saturday and Sunday. She says actually feels a little bit better today and is better with rest and when she's not as active. If she felt like she is getting worse or  develop the fever them please call back sooner rather than later. She does have a history of asthma as well. No wheezing on exam today. Make sure use albuterol as needed and liberally while she is ill. New prescription sent for refill on albuterol.  Yeast dermatitis - will prescribe him a statin/triamcinolone cream. Can use twice a day as needed. We could also consider nystatin powder if she feels like she is getting a lot of moisture around the breasts.

## 2016-01-01 NOTE — Patient Instructions (Signed)

## 2017-10-21 HISTORY — PX: COLONOSCOPY: SHX174

## 2018-02-26 LAB — HM PAP SMEAR: HM PAP: NEGATIVE

## 2018-07-02 LAB — HM COLONOSCOPY

## 2018-09-04 ENCOUNTER — Ambulatory Visit: Payer: 59 | Admitting: Family Medicine

## 2018-09-04 ENCOUNTER — Encounter: Payer: Self-pay | Admitting: Family Medicine

## 2018-09-04 ENCOUNTER — Ambulatory Visit: Payer: 59 | Admitting: Physician Assistant

## 2018-09-04 VITALS — BP 150/89 | HR 84 | Ht 63.0 in | Wt 244.0 lb

## 2018-09-04 DIAGNOSIS — R059 Cough, unspecified: Secondary | ICD-10-CM

## 2018-09-04 DIAGNOSIS — R05 Cough: Secondary | ICD-10-CM | POA: Diagnosis not present

## 2018-09-04 DIAGNOSIS — I10 Essential (primary) hypertension: Secondary | ICD-10-CM | POA: Diagnosis not present

## 2018-09-04 DIAGNOSIS — J45990 Exercise induced bronchospasm: Secondary | ICD-10-CM

## 2018-09-04 MED ORDER — ALBUTEROL SULFATE HFA 108 (90 BASE) MCG/ACT IN AERS: 2.0000 | INHALATION_SPRAY | Freq: Four times a day (QID) | RESPIRATORY_TRACT | 1 refills | Status: AC | PRN

## 2018-09-04 MED ORDER — BENZONATATE 200 MG PO CAPS: 200.0000 mg | ORAL_CAPSULE | Freq: Three times a day (TID) | ORAL | 0 refills | Status: DC | PRN

## 2018-09-04 MED ORDER — PREDNISONE 10 MG PO TABS: 30.0000 mg | ORAL_TABLET | Freq: Every day | ORAL | 0 refills | Status: DC

## 2018-09-04 MED ORDER — AZITHROMYCIN 250 MG PO TABS: 250.0000 mg | ORAL_TABLET | Freq: Every day | ORAL | 0 refills | Status: DC

## 2018-09-04 NOTE — Patient Instructions (Addendum)
Thank you for coming in today.  Take prednisone daily.  Use albuterol as needed.  Use the tessalon pearles as needed for cough.  If not better or if worse fill and take azithromycin antibiotic.  Schedule a follow up visit about blood pressure with Dr Linford ArnoldMetheney in January. Consider Tdap vaccine.       Acute Bronchitis, Adult Acute bronchitis is when air tubes (bronchi) in the lungs suddenly get swollen. The condition can make it hard to breathe. It can also cause these symptoms:  A cough.  Coughing up clear, yellow, or green mucus.  Wheezing.  Chest congestion.  Shortness of breath.  A fever.  Body aches.  Chills.  A sore throat.  Follow these instructions at home: Medicines  Take over-the-counter and prescription medicines only as told by your doctor.  If you were prescribed an antibiotic medicine, take it as told by your doctor. Do not stop taking the antibiotic even if you start to feel better. General instructions  Rest.  Drink enough fluids to keep your pee (urine) clear or pale yellow.  Avoid smoking and secondhand smoke. If you smoke and you need help quitting, ask your doctor. Quitting will help your lungs heal faster.  Use an inhaler, cool mist vaporizer, or humidifier as told by your doctor.  Keep all follow-up visits as told by your doctor. This is important. How is this prevented? To lower your risk of getting this condition again:  Wash your hands often with soap and water. If you cannot use soap and water, use hand sanitizer.  Avoid contact with people who have cold symptoms.  Try not to touch your hands to your mouth, nose, or eyes.  Make sure to get the flu shot every year.  Contact a doctor if:  Your symptoms do not get better in 2 weeks. Get help right away if:  You cough up blood.  You have chest pain.  You have very bad shortness of breath.  You become dehydrated.  You faint (pass out) or keep feeling like you are going to  pass out.  You keep throwing up (vomiting).  You have a very bad headache.  Your fever or chills gets worse. This information is not intended to replace advice given to you by your health care provider. Make sure you discuss any questions you have with your health care provider. Document Released: 03/25/2008 Document Revised: 05/15/2016 Document Reviewed: 03/27/2016 Elsevier Interactive Patient Education  [employer]2018 Else[employer] Inc.

## 2018-09-04 NOTE — Progress Notes (Signed)
Jordan Hickman is a 42 y.o. female who [facility] today for cough congestion present for 2 weeks.  Patient initially had hoarse voice which is slowly improving.  She notes continued runny nose.  Try Mucinex and Sudafed and Advil which helped a little.  She was improving then worsened again.  She denies fevers or chills vomiting or diarrhea currently.  She has a history of exercise-induced asthma.  She has used an old leftover albuterol inhaler for this current episode which helps a little.  She notes that she is scheduled to have a hysterectomy next month.  She does not have a follow-up visit with her PCP scheduled.  She notes that her blood pressures been elevated previously.  She does not have a diagnosis of hypertension and is not on treatment for this.   ROS as above:  Exam:  BP (!) 150/89   Pulse 84   Ht 5\' 3"  (1.6 m)   Wt 244 lb (110.7 kg)   BMI 43.22 kg/m  Wt Readings from Last 5 Encounters:  09/04/18 244 lb (110.7 kg)  01/01/16 229 lb (103.9 kg)  07/06/14 235 lb (106.6 kg)  12/14/12 230 lb (104.3 kg)  06/02/12 220 lb (99.8 kg)    Gen: Well NAD HEENT: EOMI,  MMM clear nasal discharge.  Nasal turbinates inflamed.  Mildly tender maxillary sinus. Lungs: Normal work of breathing. CTABL Heart: RRR no MRG Abd: NABS, Soft. Nondistended, Nontender Exts: Brisk capillary refill, warm and well perfused.   Lab and Radiology Results No results found for this or any previous visit (from the past 72 hour(s)). No results found.    Assessment and Plan: 42 y.o. female with bronchitis likely.  Asthma history likely orthopedic factor here as well.  Plan to treat with albuterol inhaler, prednisone, and Tessalon Perles.  Back-up azithromycin printed filled if patient does not improve.  Follow-up with PCP in 1 month or so to reassess hypertension and breathing.   Patient will likely need antihypertensive.   No orders of the defined types were placed in this encounter.  Meds ordered this encounter  Medications  . albuterol (VENTOLIN HFA) 108 (90 Base) MCG/ACT inhaler    Sig: Inhale 2 puffs into the lungs every 6 (six) hours as needed for wheezing.    Dispense:  1 Inhaler    Refill:  1  . predniSONE (DELTASONE) 10 MG tablet    Sig: Take 3 tablets (30 mg total) by mouth daily with breakfast.    Dispense:  15 tablet    Refill:  0  . benzonatate (TESSALON) 200 MG capsule    Sig: Take 1 capsule (200 mg total) by mouth 3 (three) times daily as needed for cough.    Dispense:  45 capsule    Refill:  0  . azithromycin (ZITHROMAX) 250 MG tablet    Sig: Take 1 tablet (250 mg total) by mouth daily. Take first 2 tablets together, then 1 every day until finished.    Dispense:  6 tablet    Refill:  0     Historical information moved to improve visibility of documentation.  Past Medical History:  Diagnosis Date  . Asthma    exercise induced   Past Surgical History:  Procedure Laterality Date  . CESAREAN SECTION  12-21-99  . TONSILLECTOMY  1990's   Social History   Tobacco Use  . Smoking status: Never Smoker  . Smokeless tobacco: Never  Used  Substance Use Topics  . Alcohol use: Yes   family history includes Diabetes in her father; Hypertension in her father and mother; Other in her daughter; Sleep apnea in her father; Stroke in her other; Thyroid disease in her mother.  Medications: Current Outpatient Medications  Medication Sig Dispense Refill  . albuterol (VENTOLIN HFA) 108 (90 Base) MCG/ACT inhaler Inhale 2 puffs into the lungs every 6 (six) hours as needed for wheezing. 1 Inhaler 1  . fluticasone (FLONASE) 50 MCG/ACT nasal spray Place 2 sprays into the nose daily. 16 g 1  . nystatin-triamcinolone (MYCOLOG II) cream Apply 1 application topically 2 (two) times daily. 30 g 1  . azithromycin (ZITHROMAX) 250 MG tablet Take 1 tablet (250 mg  total) by mouth daily. Take first 2 tablets together, then 1 every day until finished. 6 tablet 0  . benzonatate (TESSALON) 200 MG capsule Take 1 capsule (200 mg total) by mouth 3 (three) times daily as needed for cough. 45 capsule 0  . predniSONE (DELTASONE) 10 MG tablet Take 3 tablets (30 mg total) by mouth daily with breakfast. 15 tablet 0   No current facility-administered medications for this visit.    No Known Allergies   Discussed warning signs or symptoms. Please see discharge instructions. Patient expresses understanding.

## 2018-09-07 ENCOUNTER — Encounter (HOSPITAL_BASED_OUTPATIENT_CLINIC_OR_DEPARTMENT_OTHER): Payer: Self-pay | Admitting: *Deleted

## 2018-09-11 NOTE — H&P (Signed)
42 year old G 4 P 3 admitted for TAH, BS. She had and in office hysteroscopy and D and C and we were unable to complete HTA because she has an uterine window and her uterus is adherent to her anterior abdominal wall.  She repeat heavy bleeding and a lot of dysmenorrhea.  Past Medical History:  Diagnosis Date  . Asthma    exercise induced   Past Surgical History:  Procedure Laterality Date  . CESAREAN SECTION  12-21-99  . TONSILLECTOMY  1990's   Prior to Admission medications   Medication Sig Start Date End Date Taking? Authorizing Provider  albuterol (VENTOLIN HFA) 108 (90 Base) MCG/ACT inhaler Inhale 2 puffs into the lungs every 6 (six) hours as needed for wheezing. 09/04/18   Rodolph Bong, MD  azithromycin (ZITHROMAX) 250 MG tablet Take 1 tablet (250 mg total) by mouth daily. Take first 2 tablets together, then 1 every day until finished. 09/04/18   Rodolph Bong, MD  benzonatate (TESSALON) 200 MG capsule Take 1 capsule (200 mg total) by mouth 3 (three) times daily as needed for cough. 09/04/18   Rodolph Bong, MD  fluticasone (FLONASE) 50 MCG/ACT nasal spray Place 2 sprays into the nose daily. 12/14/12   Agapito Games, MD  nystatin-triamcinolone (MYCOLOG II) cream Apply 1 application topically 2 (two) times daily. 01/01/16   Agapito Games, MD  predniSONE (DELTASONE) 10 MG tablet Take 3 tablets (30 mg total) by mouth daily with breakfast. 09/04/18   Rodolph Bong, MD   Allergies Patient has no known allergies. Family History  Problem Relation Age of Onset  . Stroke Other   . Diabetes Father   . Hypertension Father   . Sleep apnea Father        morbidly obese.   . Other Daughter        Leomyosarcoma- right bicep- in remission  . Hypertension Mother   . Thyroid disease Mother    Social History   Socioeconomic History  . Marital status: Married    Spouse name: Not on file  . Number of children: Not on file  . Years of education: Not on file  . Highest education  level: Not on file  Occupational History  . Not on file  Social Needs  . Financial resource strain: Not on file  . Food insecurity:    Worry: Not on file    Inability: Not on file  . Transportation needs:    Medical: Not on file    Non-medical: Not on file  Tobacco Use  . Smoking status: Never Smoker  . Smokeless tobacco: Never Used  Substance and Sexual Activity  . Alcohol use: Yes  . Drug use: No  . Sexual activity: Not on file  Lifestyle  . Physical activity:    Days per week: Not on file    Minutes per session: Not on file  . Stress: Not on file  Relationships  . Social connections:    Talks on phone: Not on file    Gets together: Not on file    Attends religious service: Not on file    Active member of club or organization: Not on file    Attends meetings of clubs or organizations: Not on file    Relationship status: Not on file  Other Topics Concern  . Not on file  Social History Narrative  . Not on file   General alert and oriented Lung CTAB Car RRR Abdomen is soft and  non tender Pelvic cervix is deep in vagina   And above  IMPRESSION: Probable adhesions Pelvic pain, dysmenorrhea Menorrhagia  PLAN: TAH and BS Risks reviewed Consent signed

## 2018-09-22 NOTE — Patient Instructions (Addendum)
Jordan A Yetta Flock[place]urnane  09/22/2018      Your procedure is scheduled on  10-01-18   [facility] [place]  at  5:30A.M.   Call this number if you have problems the morning of surgery:[phone]  PLEASE BRING CPAP MASK AND  TUBING ONLY. DEVICE WILL BE PROVIDED!PLEASE BRING CPAP MASK AND  TUBING ONLY. DEVICE WILL BE PROVIDED!    OUR ADDRESS IS [address], WE ARE LOCATED IN THE [facility] WITH [facility].   Remember:  Do not eat food or drink liquids after midnight.  Take these medicines the morning of surgery with A SIP OF WATER: tylenol if needed; albuterol inhaler if needed   Do not wear jewelry, make-up or nail polish.  Do not wear lotions, powders, or perfumes, or deoderant.  Do not shave 48 hours prior to surgery.  Men may shave face and neck.  Do not bring valuables to the hospital.  [facility] is not responsible for any belongings or valuables.  Contacts, dentures or bridgework may not be worn into surgery.  Leave your suitcase in the car.  After surgery it may be brought to your room.  For patients admitted to the hospital, discharge time will be determined by your treatment team.  Patients discharged the day of surgery will not be allowed to drive home.   Special instructions:  n/a  Please read over the following fact sheets that you were given:       [facility] - Preparing for Surgery Before surgery, you can play an important role.  Because skin is not sterile, your skin needs to be as free of germs as possible.  You can reduce the number of germs on your skin by washing with CHG (chlorahexidine gluconate) soap before surgery.  CHG is an antiseptic cleaner which kills germs and bonds with the skin to continue killing germs even after washing. Please DO NOT use if you have an allergy to CHG or antibacterial soaps.  If your skin becomes reddened/irritated stop using the CHG and inform your nurse when you arrive at Short Stay. Do not  shave (including legs and underarms) for at least 48 hours prior to the first CHG shower.  You may shave your face/neck. Please follow these instructions carefully:  1.  Shower with CHG Soap the night before surgery and the  morning of Surgery.  2.  If you choose to wash your hair, wash your hair first as usual with your  normal  shampoo.  3.  After you shampoo, rinse your hair and body thoroughly to remove the  shampoo.                           4.  Use CHG as you would any other liquid soap.  You can apply chg directly  to the skin and wash                       Gently with a scrungie or clean washcloth.  5.  Apply the CHG Soap to your body ONLY FROM THE NECK DOWN.   Do not use on face/ open                           Wound or open sores. Avoid contact with eyes, ears mouth and genitals (private parts).  Wash face,  Genitals (private parts) with your normal soap.             6.  Wash thoroughly, paying special attention to the area where your surgery  will be performed.  7.  Thoroughly rinse your body with warm water from the neck down.  8.  DO NOT shower/wash with your normal soap after using and rinsing off  the CHG Soap.                9.  Pat yourself dry with a clean towel.            10.  Wear clean pajamas.            11.  Place clean sheets on your bed the night of your first shower and do not  sleep with pets. Day of Surgery : Do not apply any lotions/deodorants the morning of surgery.  Please wear clean clothes to the hospital/surgery center.  FAILURE TO FOLLOW THESE INSTRUCTIONS MAY RESULT IN THE CANCELLATION OF YOUR SURGERY PATIENT SIGNATURE_________________________________  NURSE SIGNATURE__________________________________  ________________________________________________________________________

## 2018-09-24 ENCOUNTER — Encounter (HOSPITAL_COMMUNITY)
Admission: RE | Admit: 2018-09-24 | Discharge: 2018-09-24 | Disposition: A | Discharge status: Home / Self Care | Payer: 59 | Source: Ambulatory Visit | Attending: Obstetrics and Gynecology | Admitting: Obstetrics and Gynecology

## 2018-09-24 ENCOUNTER — Encounter (HOSPITAL_COMMUNITY): Payer: Self-pay

## 2018-09-24 ENCOUNTER — Other Ambulatory Visit: Payer: Self-pay

## 2018-09-24 DIAGNOSIS — N920 Excessive and frequent menstruation with regular cycle: Secondary | ICD-10-CM | POA: Diagnosis not present

## 2018-09-24 DIAGNOSIS — R030 Elevated blood-pressure reading, without diagnosis of hypertension: Secondary | ICD-10-CM

## 2018-09-24 DIAGNOSIS — R102 Pelvic and perineal pain: Secondary | ICD-10-CM | POA: Diagnosis not present

## 2018-09-24 DIAGNOSIS — Z01818 Encounter for other preprocedural examination: Secondary | ICD-10-CM | POA: Insufficient documentation

## 2018-09-24 HISTORY — DX: Excessive and frequent menstruation with regular cycle: N92.0

## 2018-09-24 HISTORY — DX: Elevated blood-pressure reading, without diagnosis of hypertension: R03.0

## 2018-09-24 HISTORY — DX: Iron deficiency: E61.1

## 2018-09-24 HISTORY — DX: Sleep apnea, unspecified: G47.30

## 2018-09-24 LAB — CBC
HEMATOCRIT: 38.7 % (ref 36.0–46.0)
Hemoglobin: 11.8 g/dL — ABNORMAL LOW (ref 12.0–15.0)
MCH: 24.4 pg — ABNORMAL LOW (ref 26.0–34.0)
MCHC: 30.5 g/dL (ref 30.0–36.0)
MCV: 80.1 fL (ref 80.0–100.0)
Platelets: 330 10*3/uL (ref 150–400)
RBC: 4.83 MIL/uL (ref 3.87–5.11)
RDW: 15.1 % (ref 11.5–15.5)
WBC: 9.5 10*3/uL (ref 4.0–10.5)
nRBC: 0 % (ref 0.0–0.2)

## 2018-09-24 LAB — ABO/RH: ABO/RH(D): A POS

## 2018-10-01 ENCOUNTER — Inpatient Hospital Stay (HOSPITAL_BASED_OUTPATIENT_CLINIC_OR_DEPARTMENT_OTHER): Payer: 59 | Admitting: Anesthesiology

## 2018-10-01 ENCOUNTER — Encounter (HOSPITAL_COMMUNITY)
Admission: AD | Disposition: A | Discharge status: Home / Self Care | Payer: Self-pay | Source: Ambulatory Visit | Attending: Obstetrics and Gynecology

## 2018-10-01 ENCOUNTER — Encounter (HOSPITAL_BASED_OUTPATIENT_CLINIC_OR_DEPARTMENT_OTHER): Payer: Self-pay

## 2018-10-01 ENCOUNTER — Inpatient Hospital Stay (HOSPITAL_BASED_OUTPATIENT_CLINIC_OR_DEPARTMENT_OTHER)
Admission: AD | Admit: 2018-10-01 | Discharge: 2018-10-05 | DRG: 742 | Disposition: A | Discharge status: Home / Self Care | Payer: 59 | Source: Ambulatory Visit | Attending: Obstetrics and Gynecology | Admitting: Obstetrics and Gynecology

## 2018-10-01 ENCOUNTER — Other Ambulatory Visit: Payer: Self-pay

## 2018-10-01 DIAGNOSIS — Z6841 Body Mass Index (BMI) 40.0 and over, adult: Secondary | ICD-10-CM | POA: Diagnosis not present

## 2018-10-01 DIAGNOSIS — Z7951 Long term (current) use of inhaled steroids: Secondary | ICD-10-CM | POA: Diagnosis not present

## 2018-10-01 DIAGNOSIS — E669 Obesity, unspecified: Secondary | ICD-10-CM | POA: Diagnosis present

## 2018-10-01 DIAGNOSIS — J45909 Unspecified asthma, uncomplicated: Secondary | ICD-10-CM | POA: Diagnosis present

## 2018-10-01 DIAGNOSIS — N882 Stricture and stenosis of cervix uteri: Secondary | ICD-10-CM | POA: Diagnosis present

## 2018-10-01 DIAGNOSIS — G4730 Sleep apnea, unspecified: Secondary | ICD-10-CM | POA: Diagnosis present

## 2018-10-01 DIAGNOSIS — N920 Excessive and frequent menstruation with regular cycle: Principal | ICD-10-CM | POA: Diagnosis present

## 2018-10-01 DIAGNOSIS — K567 Ileus, unspecified: Secondary | ICD-10-CM | POA: Diagnosis not present

## 2018-10-01 DIAGNOSIS — N736 Female pelvic peritoneal adhesions (postinfective): Secondary | ICD-10-CM | POA: Diagnosis present

## 2018-10-01 DIAGNOSIS — N946 Dysmenorrhea, unspecified: Secondary | ICD-10-CM | POA: Diagnosis present

## 2018-10-01 DIAGNOSIS — R102 Pelvic and perineal pain: Secondary | ICD-10-CM | POA: Diagnosis present

## 2018-10-01 DIAGNOSIS — Z90710 Acquired absence of both cervix and uterus: Secondary | ICD-10-CM | POA: Diagnosis present

## 2018-10-01 HISTORY — PX: BILATERAL SALPINGECTOMY: SHX5743

## 2018-10-01 HISTORY — PX: ABDOMINAL HYSTERECTOMY: SHX81

## 2018-10-01 HISTORY — PX: TOTAL ABDOMINAL HYSTERECTOMY: SHX209

## 2018-10-01 LAB — TYPE AND SCREEN
ABO/RH(D): A POS
Antibody Screen: NEGATIVE

## 2018-10-01 LAB — POCT PREGNANCY, URINE: Preg Test, Ur: NEGATIVE

## 2018-10-01 SURGERY — HYSTERECTOMY, ABDOMINAL
Anesthesia: General | Laterality: Bilateral

## 2018-10-01 MED ORDER — FENTANYL CITRATE (PF) 250 MCG/5ML IJ SOLN
INTRAMUSCULAR | Status: AC
  Filled 2018-10-01: qty 5

## 2018-10-01 MED ORDER — HYDROMORPHONE HCL 1 MG/ML IJ SOLN
INTRAMUSCULAR | Status: AC
  Filled 2018-10-01: qty 1

## 2018-10-01 MED ORDER — SODIUM CHLORIDE 0.9 % IV SOLN
2.0000 g | Freq: Two times a day (BID) | INTRAVENOUS | Status: DC
  Administered 2018-10-01: 2 g via INTRAVENOUS
  Filled 2018-10-01 (×2): qty 2

## 2018-10-01 MED ORDER — OXYCODONE HCL 5 MG PO TABS
5.0000 mg | ORAL_TABLET | ORAL | Status: DC | PRN
  Administered 2018-10-02 – 2018-10-04 (×12): 10 mg via ORAL
  Administered 2018-10-04: 5 mg via ORAL
  Administered 2018-10-04 – 2018-10-05 (×6): 10 mg via ORAL
  Filled 2018-10-01 (×19): qty 2

## 2018-10-01 MED ORDER — PROPOFOL 10 MG/ML IV BOLUS
INTRAVENOUS | Status: AC
  Filled 2018-10-01: qty 40

## 2018-10-01 MED ORDER — NALOXONE HCL 0.4 MG/ML IJ SOLN: 0.4000 mg | INTRAMUSCULAR | Status: DC | PRN

## 2018-10-01 MED ORDER — SUGAMMADEX SODIUM 200 MG/2ML IV SOLN
INTRAVENOUS | Status: AC
  Filled 2018-10-01: qty 2

## 2018-10-01 MED ORDER — FENTANYL CITRATE (PF) 100 MCG/2ML IJ SOLN
INTRAMUSCULAR | Status: DC | PRN
  Administered 2018-10-01 (×2): 25 ug via INTRAVENOUS
  Administered 2018-10-01 (×4): 50 ug via INTRAVENOUS

## 2018-10-01 MED ORDER — LIDOCAINE 2% (20 MG/ML) 5 ML SYRINGE
INTRAMUSCULAR | Status: DC | PRN
  Administered 2018-10-01: 80 mg via INTRAVENOUS

## 2018-10-01 MED ORDER — LACTATED RINGERS IV SOLN
INTRAVENOUS | Status: DC
  Administered 2018-10-01 (×3): via INTRAVENOUS
  Filled 2018-10-01: qty 1000

## 2018-10-01 MED ORDER — SODIUM CHLORIDE 0.9 % IV SOLN
INTRAVENOUS | Status: AC
  Filled 2018-10-01: qty 2

## 2018-10-01 MED ORDER — ONDANSETRON HCL 4 MG/2ML IJ SOLN
INTRAMUSCULAR | Status: AC
  Filled 2018-10-01: qty 2

## 2018-10-01 MED ORDER — KETOROLAC TROMETHAMINE 30 MG/ML IJ SOLN
INTRAMUSCULAR | Status: AC
  Filled 2018-10-01: qty 1

## 2018-10-01 MED ORDER — WHITE PETROLATUM EX OINT
TOPICAL_OINTMENT | CUTANEOUS | Status: AC
  Filled 2018-10-01: qty 5

## 2018-10-01 MED ORDER — ARTIFICIAL TEARS OPHTHALMIC OINT
TOPICAL_OINTMENT | OPHTHALMIC | Status: AC
  Filled 2018-10-01: qty 3.5

## 2018-10-01 MED ORDER — BUPIVACAINE HCL (PF) 0.25 % IJ SOLN
INTRAMUSCULAR | Status: DC | PRN
  Administered 2018-10-01: 30 mL

## 2018-10-01 MED ORDER — MENTHOL 3 MG MT LOZG: 1.0000 | LOZENGE | OROMUCOSAL | Status: DC | PRN

## 2018-10-01 MED ORDER — DIPHENHYDRAMINE HCL 50 MG/ML IJ SOLN: 12.5000 mg | Freq: Four times a day (QID) | INTRAMUSCULAR | Status: DC | PRN

## 2018-10-01 MED ORDER — LACTATED RINGERS IV BOLUS
500.0000 mL | Freq: Once | INTRAVENOUS | Status: AC
  Administered 2018-10-01: 500 mL via INTRAVENOUS

## 2018-10-01 MED ORDER — ONDANSETRON HCL 4 MG/2ML IJ SOLN
INTRAMUSCULAR | Status: DC | PRN
  Administered 2018-10-01: 4 mg via INTRAVENOUS

## 2018-10-01 MED ORDER — DIPHENHYDRAMINE HCL 12.5 MG/5ML PO ELIX: 12.5000 mg | ORAL_SOLUTION | Freq: Four times a day (QID) | ORAL | Status: DC | PRN

## 2018-10-01 MED ORDER — MIDAZOLAM HCL 2 MG/2ML IJ SOLN
INTRAMUSCULAR | Status: AC
  Filled 2018-10-01: qty 2

## 2018-10-01 MED ORDER — SUGAMMADEX SODIUM 200 MG/2ML IV SOLN
INTRAVENOUS | Status: DC | PRN
  Administered 2018-10-01: 220 mg via INTRAVENOUS

## 2018-10-01 MED ORDER — MIDAZOLAM HCL 2 MG/2ML IJ SOLN
INTRAMUSCULAR | Status: DC | PRN
  Administered 2018-10-01: 2 mg via INTRAVENOUS

## 2018-10-01 MED ORDER — SODIUM CHLORIDE 0.9% FLUSH: 9.0000 mL | INTRAVENOUS | Status: DC | PRN

## 2018-10-01 MED ORDER — SODIUM CHLORIDE 0.9 % IV SOLN
INTRAVENOUS | Status: DC | PRN
  Administered 2018-10-01: 70 mL

## 2018-10-01 MED ORDER — ROCURONIUM BROMIDE 10 MG/ML (PF) SYRINGE
PREFILLED_SYRINGE | INTRAVENOUS | Status: AC
  Filled 2018-10-01: qty 10

## 2018-10-01 MED ORDER — BUPIVACAINE HCL (PF) 0.25 % IJ SOLN
INTRAMUSCULAR | Status: AC
  Filled 2018-10-01: qty 30

## 2018-10-01 MED ORDER — DEXAMETHASONE SODIUM PHOSPHATE 10 MG/ML IJ SOLN
INTRAMUSCULAR | Status: AC
  Filled 2018-10-01: qty 1

## 2018-10-01 MED ORDER — DEXAMETHASONE SODIUM PHOSPHATE 10 MG/ML IJ SOLN
INTRAMUSCULAR | Status: DC | PRN
  Administered 2018-10-01: 10 mg via INTRAVENOUS

## 2018-10-01 MED ORDER — LACTATED RINGERS IV SOLN
INTRAVENOUS | Status: DC
  Administered 2018-10-01 – 2018-10-02 (×4): via INTRAVENOUS

## 2018-10-01 MED ORDER — TRAMADOL HCL 50 MG PO TABS
50.0000 mg | ORAL_TABLET | Freq: Four times a day (QID) | ORAL | Status: DC | PRN
  Administered 2018-10-04 – 2018-10-05 (×3): 50 mg via ORAL
  Filled 2018-10-01 (×3): qty 1

## 2018-10-01 MED ORDER — ROCURONIUM BROMIDE 10 MG/ML (PF) SYRINGE
PREFILLED_SYRINGE | INTRAVENOUS | Status: DC | PRN
  Administered 2018-10-01: 50 mg via INTRAVENOUS
  Administered 2018-10-01: 20 mg via INTRAVENOUS

## 2018-10-01 MED ORDER — MEPERIDINE HCL 25 MG/ML IJ SOLN
6.2500 mg | INTRAMUSCULAR | Status: DC | PRN
  Filled 2018-10-01: qty 1

## 2018-10-01 MED ORDER — LIDOCAINE 2% (20 MG/ML) 5 ML SYRINGE
INTRAMUSCULAR | Status: AC
  Filled 2018-10-01: qty 5

## 2018-10-01 MED ORDER — ALBUTEROL SULFATE (2.5 MG/3ML) 0.083% IN NEBU: 3.0000 mL | INHALATION_SOLUTION | Freq: Four times a day (QID) | RESPIRATORY_TRACT | Status: DC | PRN

## 2018-10-01 MED ORDER — ONDANSETRON HCL 4 MG/2ML IJ SOLN
4.0000 mg | Freq: Four times a day (QID) | INTRAMUSCULAR | Status: DC | PRN
  Administered 2018-10-01: 4 mg via INTRAVENOUS
  Filled 2018-10-01: qty 2

## 2018-10-01 MED ORDER — HYDROMORPHONE HCL 1 MG/ML IJ SOLN
0.2500 mg | INTRAMUSCULAR | Status: DC | PRN
  Administered 2018-10-01: 0.5 mg via INTRAVENOUS
  Administered 2018-10-01: 0.25 mg via INTRAVENOUS
  Administered 2018-10-01: 0.5 mg via INTRAVENOUS
  Administered 2018-10-01: 0.25 mg via INTRAVENOUS
  Administered 2018-10-01: 0.5 mg via INTRAVENOUS
  Administered 2018-10-01 (×2): 0.25 mg via INTRAVENOUS
  Filled 2018-10-01: qty 0.5

## 2018-10-01 MED ORDER — PROPOFOL 10 MG/ML IV BOLUS
INTRAVENOUS | Status: DC | PRN
  Administered 2018-10-01: 200 mg via INTRAVENOUS

## 2018-10-01 MED ORDER — ONDANSETRON HCL 4 MG/2ML IJ SOLN
4.0000 mg | Freq: Once | INTRAMUSCULAR | Status: DC | PRN
  Filled 2018-10-01: qty 2

## 2018-10-01 MED ORDER — HYDROMORPHONE 1 MG/ML IV SOLN
INTRAVENOUS | Status: DC
  Administered 2018-10-01: 1.8 mg via INTRAVENOUS
  Administered 2018-10-01: 14:00:00 via INTRAVENOUS
  Administered 2018-10-02: 2 mg via INTRAVENOUS
  Administered 2018-10-02: 0.8 mg via INTRAVENOUS
  Filled 2018-10-01: qty 25

## 2018-10-01 SURGICAL SUPPLY — 47 items
BAG URINE DRAINAGE (UROLOGICAL SUPPLIES) IMPLANT
BENZOIN TINCTURE PRP APPL 2/3 (GAUZE/BANDAGES/DRESSINGS) ×3 IMPLANT
BLADE EXTENDED COATED 6.5IN (ELECTRODE) IMPLANT
BLADE HEX COATED 2.75 (ELECTRODE) ×3 IMPLANT
CANISTER SUCT 3000ML PPV (MISCELLANEOUS) ×3 IMPLANT
CLOSURE WOUND 1/2 X4 (GAUZE/BANDAGES/DRESSINGS) ×1
COVER WAND RF STERILE (DRAPES) ×3 IMPLANT
DECANTER SPIKE VIAL GLASS SM (MISCELLANEOUS) IMPLANT
DERMABOND ADVANCED (GAUZE/BANDAGES/DRESSINGS) ×2
DERMABOND ADVANCED .7 DNX12 (GAUZE/BANDAGES/DRESSINGS) ×1 IMPLANT
DRAPE WARM FLUID 44X44 (DRAPE) ×3 IMPLANT
DRSG OPSITE POSTOP 4X10 (GAUZE/BANDAGES/DRESSINGS) ×3 IMPLANT
DURAPREP 26ML APPLICATOR (WOUND CARE) ×3 IMPLANT
GLOVE BIO SURGEON STRL SZ 6.5 (GLOVE) ×4 IMPLANT
GLOVE BIO SURGEONS STRL SZ 6.5 (GLOVE) ×2
GLOVE BIOGEL PI IND STRL 7.0 (GLOVE) ×3 IMPLANT
GLOVE BIOGEL PI INDICATOR 7.0 (GLOVE) ×6
GOWN STRL REUS W/TWL LRG LVL3 (GOWN DISPOSABLE) ×9 IMPLANT
HEMOSTAT ARISTA ABSORB 3G PWDR (MISCELLANEOUS) ×3 IMPLANT
HOLDER FOLEY CATH W/STRAP (MISCELLANEOUS) ×3 IMPLANT
KIT TURNOVER CYSTO (KITS) ×3 IMPLANT
NEEDLE HYPO 22GX1.5 SAFETY (NEEDLE) ×3 IMPLANT
NS IRRIG 500ML POUR BTL (IV SOLUTION) ×3 IMPLANT
PACK ABDOMINAL GYN (CUSTOM PROCEDURE TRAY) ×3 IMPLANT
PAD OB MATERNITY 4.3X12.25 (PERSONAL CARE ITEMS) ×3 IMPLANT
PENCIL BUTTON HOLSTER BLD 10FT (ELECTRODE) ×3 IMPLANT
SEPRAFILM MEMBRANE 5X6 (MISCELLANEOUS) IMPLANT
SPONGE LAP 18X18 RF (DISPOSABLE) IMPLANT
SPONGE LAP 4X18 RFD (DISPOSABLE) IMPLANT
STAPLER VISISTAT 35W (STAPLE) IMPLANT
STRIP CLOSURE SKIN 1/2X4 (GAUZE/BANDAGES/DRESSINGS) ×2 IMPLANT
SUT PDS AB 0 CT1 36 (SUTURE) ×3 IMPLANT
SUT PDS AB 0 CTX 60 (SUTURE) ×3 IMPLANT
SUT PLAIN 2 0 XLH (SUTURE) ×3 IMPLANT
SUT VIC AB 0 CT1 18XCR BRD8 (SUTURE) ×2 IMPLANT
SUT VIC AB 0 CT1 27 (SUTURE) ×8
SUT VIC AB 0 CT1 27XBRD ANBCTR (SUTURE) ×4 IMPLANT
SUT VIC AB 0 CT1 8-18 (SUTURE) ×4
SUT VIC AB 3-0 PS1 18 (SUTURE)
SUT VIC AB 3-0 PS1 18X BRD (SUTURE) IMPLANT
SUT VIC AB 4-0 KS 27 (SUTURE) ×3 IMPLANT
SUT VICRYL 0 TIES 12 18 (SUTURE) ×3 IMPLANT
SYR BULB IRRIGATION 50ML (SYRINGE) ×3 IMPLANT
SYR CONTROL 10ML LL (SYRINGE) ×3 IMPLANT
TOWEL OR 17X24 6PK STRL BLUE (TOWEL DISPOSABLE) ×6 IMPLANT
TRAY FOLEY W/BAG SLVR 14FR (SET/KITS/TRAYS/PACK) ×3 IMPLANT
WATER STERILE IRR 500ML POUR (IV SOLUTION) ×3 IMPLANT

## 2018-10-01 NOTE — Progress Notes (Signed)
PT placed on own CPAP unit (PT unsure of settings- but states she is getting enough air on inhalation and able to exhale fully) , own circuit- but hospital issued FFMM due to post surgery PCA (uses nasal pillows at home) with 2 lpm 02 bleed in. RN aware.

## 2018-10-01 NOTE — Anesthesia Procedure Notes (Signed)
Procedure Name: Intubation Date/Time: 10/01/2018 7:34 AM Performed by: Suan Halter, CRNA Pre-anesthesia Checklist: Patient identified, Emergency Drugs available, Suction available and Patient being monitored Patient Re-evaluated:Patient Re-evaluated prior to induction Oxygen Delivery Method: Circle system utilized Preoxygenation: Pre-oxygenation with 100% oxygen Induction Type: IV induction Ventilation: Mask ventilation without difficulty Laryngoscope Size: Mac and 3 Grade View: Grade III Tube type: Oral Tube size: 7.0 mm Number of attempts: 1 Airway Equipment and Method: Stylet and Oral airway Placement Confirmation: ETT inserted through vocal cords under direct vision,  positive ETCO2 and breath sounds checked- equal and bilateral Secured at: 22 cm Tube secured with: Tape Dental Injury: Teeth and Oropharynx as per pre-operative assessment

## 2018-10-01 NOTE — Progress Notes (Signed)
H and P on the chart Will proceed with TAH and BS Consent signed

## 2018-10-01 NOTE — Progress Notes (Signed)
MD notified that patient's urine is pink tinged. Vital signs are stable. MD advised to monitor urine and keep foley in until she does her rounds in the morning.

## 2018-10-01 NOTE — Progress Notes (Signed)
Pt. Urine has changed to light red. Will updated MD.

## 2018-10-01 NOTE — Transfer of Care (Signed)
Immediate Anesthesia Transfer of Care Note  Patient: Jordan Hickman  Procedure(s) Performed: Procedure(s) (LRB): HYSTERECTOMY ABDOMINAL, bilateral salpingectomy (Bilateral)  Patient Location: PACU  Anesthesia Type: General  Level of Consciousness: awake, oriented, sedated and patient cooperative  Airway & Oxygen Therapy: Patient Spontanous Breathing and Patient connected to face mask oxygen  Post-op Assessment: Report given to PACU RN and Post -op Vital signs reviewed and stable  Post vital signs: Reviewed and stable  Complications: No apparent anesthesia complications Last Vitals:  Vitals Value Taken Time  BP 158/90 10/01/2018  9:25 AM  Temp 36.3 C 10/01/2018  9:25 AM  Pulse 94 10/01/2018  9:29 AM  Resp 19 10/01/2018  9:29 AM  SpO2 96 % 10/01/2018  9:29 AM  Vitals shown include unvalidated device data.  Last Pain:  Vitals:   10/01/18 0617  TempSrc:   PainSc: 3       Patients Stated Pain Goal: 8 (10/01/18 0617)

## 2018-10-01 NOTE — Anesthesia Preprocedure Evaluation (Addendum)
Anesthesia Evaluation  Patient identified by MRN, date of birth, ID band Patient awake    Reviewed: Allergy & Precautions, NPO status , Patient's Chart, lab work & pertinent test results  Airway Mallampati: I  TM Distance: >3 FB Neck ROM: Full    Dental   Pulmonary asthma , sleep apnea ,    Pulmonary exam normal        Cardiovascular Normal cardiovascular exam     Neuro/Psych    GI/Hepatic   Endo/Other    Renal/GU      Musculoskeletal   Abdominal   Peds  Hematology   Anesthesia Other Findings   Reproductive/Obstetrics                             Anesthesia Physical Anesthesia Plan  ASA: II  Anesthesia Plan: General   Post-op Pain Management:    Induction: Intravenous  PONV Risk Score and Plan: 3 and Midazolam, Ondansetron and Dexamethasone  Airway Management Planned: Oral ETT  Additional Equipment:   Intra-op Plan:   Post-operative Plan: Extubation in OR  Informed Consent: I have reviewed the patients History and Physical, chart, labs and discussed the procedure including the risks, benefits and alternatives for the proposed anesthesia with the patient or authorized representative who has indicated his/her understanding and acceptance.     Plan Discussed with: CRNA and Surgeon  Anesthesia Plan Comments:         Anesthesia Quick Evaluation

## 2018-10-01 NOTE — Anesthesia Postprocedure Evaluation (Signed)
Anesthesia Post Note  Patient: Jordan Hickman  Procedure(s) Performed: HYSTERECTOMY ABDOMINAL, bilateral salpingectomy (Bilateral )     Patient location during evaluation: PACU Anesthesia Type: General Level of consciousness: awake and alert Pain management: pain level controlled Vital Signs Assessment: post-procedure vital signs reviewed and stable Respiratory status: spontaneous breathing, nonlabored ventilation, respiratory function stable and patient connected to nasal cannula oxygen Cardiovascular status: blood pressure returned to baseline and stable Postop Assessment: no apparent nausea or vomiting Anesthetic complications: no    Last Vitals:  Vitals:   10/01/18 1140 10/01/18 1145  BP:  (!) 151/94  Pulse: 82 90  Resp: 19 13  Temp:    SpO2: 99% 98%    Last Pain:  Vitals:   10/01/18 1200  TempSrc:   PainSc: Asleep                 Lacheryl Niesen DAVID

## 2018-10-01 NOTE — Progress Notes (Signed)
Patient is in pain BP (!) 147/89 (BP Location: Right Arm)   Pulse 97   Temp (!) 97.5 F (36.4 C) (Oral)   Resp 20   Ht 5\' 3"  (1.6 m)   Wt 110.3 kg   LMP 09/28/2018 (Exact Date)   SpO2 97%   BMI 43.06 kg/m  Results for orders placed or performed during the hospital encounter of 10/01/18 (from the past 24 hour(s))  Pregnancy, urine POC     Status: None   Collection Time: 10/01/18  6:18 AM  Result Value Ref Range   Preg Test, Ur NEGATIVE NEGATIVE   Urine output is clear and adequate  Abdomen is soft and non tender Bandage - small area of blood on left corner otherwise is dry  No active bleeding  POD # 0 doing well Routine care Remove foley tomorrow at 0500 Ambulate this evening

## 2018-10-01 NOTE — Brief Op Note (Signed)
10/01/2018  9:20 AM  PATIENT:  Jordan Hickman  42 y.o. female  PRE-OPERATIVE DIAGNOSIS:  Menorrhagia Pelvic Pain Cervical Stenosis  POST-OPERATIVE DIAGNOSIS:  Same Pelvic Adhesions  PROCEDURE:  Procedure(s): HYSTERECTOMY ABDOMINAL, bilateral salpingectomy (Bilateral)  Extensive Lysis of Adhesions Placement of EXPAREL  SURGEON:  Surgeon(s) and Role:    Marcelle Overlie* Farhiya Rosten, MD - Primary  PHYSICIAN ASSISTANT:   ASSISTANTS:DR. Varney Baasonald Neal  ANESTHESIA:   general  EBL:  125 mL   BLOOD ADMINISTERED:none  DRAINS: Urinary Catheter (Foley)   LOCAL MEDICATIONS USED:  OTHER Exparel with Bupivacaine  SPECIMEN:  Source of Specimen:  cervix, uterus and tubes  DISPOSITION OF SPECIMEN:  PATHOLOGY  COUNTS:  YES  TOURNIQUET:  * No tourniquets in log *  DICTATION: .Other Dictation: Dictation Number dictated  PLAN OF CARE: Admit to inpatient   PATIENT DISPOSITION:  PACU - hemodynamically stable.   Delay start of Pharmacological VTE agent (>24hrs) due to surgical blood loss or risk of bleeding: not applicable

## 2018-10-02 ENCOUNTER — Encounter (HOSPITAL_BASED_OUTPATIENT_CLINIC_OR_DEPARTMENT_OTHER): Payer: Self-pay | Admitting: Obstetrics and Gynecology

## 2018-10-02 LAB — CBC
HCT: 32.8 % — ABNORMAL LOW (ref 36.0–46.0)
Hemoglobin: 9.9 g/dL — ABNORMAL LOW (ref 12.0–15.0)
MCH: 24.4 pg — ABNORMAL LOW (ref 26.0–34.0)
MCHC: 30.2 g/dL (ref 30.0–36.0)
MCV: 80.8 fL (ref 80.0–100.0)
Platelets: 330 10*3/uL (ref 150–400)
RBC: 4.06 MIL/uL (ref 3.87–5.11)
RDW: 14.9 % (ref 11.5–15.5)
WBC: 14.7 10*3/uL — ABNORMAL HIGH (ref 4.0–10.5)
nRBC: 0 % (ref 0.0–0.2)

## 2018-10-02 LAB — BASIC METABOLIC PANEL
Anion gap: 8 (ref 5–15)
BUN: 9 mg/dL (ref 6–20)
CO2: 26 mmol/L (ref 22–32)
Calcium: 8.2 mg/dL — ABNORMAL LOW (ref 8.9–10.3)
Chloride: 103 mmol/L (ref 98–111)
Creatinine, Ser: 0.62 mg/dL (ref 0.44–1.00)
GFR calc non Af Amer: >60 mL/min (ref >60)
Glucose, Bld: 117 mg/dL — ABNORMAL HIGH (ref 70–99)
Potassium: 3.9 mmol/L (ref 3.5–5.1)
Sodium: 137 mmol/L (ref 135–145)

## 2018-10-02 NOTE — Op Note (Signed)
NAME: Jordan Hickman, Jordan Hickman MEDICAL RECORD ZO:[id number] ACCOUNT [id number] DATE OF BIRTH:1976-06-23 FACILITY: [facility] LOCATION: [facility] PHYSICIAN:Dilpreet Faires Rosita Fire, MD  OPERATIVE REPORT  DATE OF PROCEDURE:  10/01/2018  PREOPERATIVE DIAGNOSES:  Menorrhagia, pelvic pain, cervical stenosis.  POSTOPERATIVE DIAGNOSES:  Menorrhagia, pelvic pain, cervical stenosis, pelvic adhesions.  PROCEDURE:  Total abdominal hysterectomy, bilateral salpingectomy, extensive lysis of adhesions and placement of Exparel.  SURGEON:  Marcelle Overlie, MD  ASSISTANT:  Freddy Finner, MD  ANESTHESIA:  General.  ESTIMATED BLOOD LOSS:  125 mL.  DESCRIPTION OF PROCEDURE:  The patient was taken to the operating room.  Consent had already been obtained.  She was placed on the operating room table, and she was intubated without any difficulty.  She was then prepped and draped.  A Foley catheter was  inserted.  A timeout was performed.  A low transverse incision was made and carried down to the fascia.  The fascia was scored in the midline and extended laterally.  There was a significant amount of adhesions noted in the subcutaneous area as well as  around the fascia.  The peritoneum was entered high, and the peritoneal incision was gently opened with sharp and blunt dissection.  We could see that there were lots of adhesions of the bladder up high on the peritoneum.  We then placed an  O'Connor-O'Sullivan retractor into the abdominal cavity and placed the large and small bowel on the upper abdomen.  We identified the uterus.  The uterus was enlarged and appeared to be myomatous.  Adnexa were normal, but there were lots of dense  adhesions involving the bladder to the anterior portion of the uterus.  We grasped the uterus with Kelly clamps and elevated.  I first suture ligated the round ligament on the right side.  The pedicles were suture ligated, and then it was cauterized.   Using both sharp and blunt dissection, we took the  anterior and posterior lobes of the broad ligament down.  We did this on the right side and on the left side in a similar fashion.  I then placed curved Heaney clamps beneath the triple pedicle on the  right.  The pedicle was clamped, cut and suture ligated using 0 Vicryl suture.  It was also further secured using a free tie of 0 Vicryl suture.  This was done on the left side as well.  We then turned our attention anteriorly.  By careful dissection, we  used sharp and some blunt dissection to move the bladder down, but it was very densely adherent.  We then skeletonized the uterine arteries on either side in standard fashion and placed curved Heaney clamps to the level of the internal os.  Each pedicle  was clamped, cut and suture ligated using 0 Vicryl suture.  At this point, we decided to amputate the uterus so that we could further see the cervix, especially on a plane with the bladder.  So we removed the specimen identified at its fundus and  elevated the cervix with Kocher clamps.  We then did further sharp dissection to get the bladder down further, and that was easily done.  We then used a series of straight clamps, staying snug beside the cervix and uterus, and each pedicle was clamped,  cut and suture ligated using 0 Vicryl suture.  We reached the level of the external os and the specimen.  We entered the vagina, and the specimen was removed and identified as cervix.  We then placed our angle stitches and  closed the cuff completely in a  running locked stitch.  At this point, we performed irrigation.  Irrigation was clear.  There were some small areas of oozing along the areas were we had taken adhesions down, which was easily cauterized on the surface only with the Bovie.  We then used  a Deaver to retract. At that point, there was noted to be a small pink stain, not red, but pink in the urine; however, we checked the bladder integrity, and we could see that everything looked very good.  We felt  like it may be due to the dissection as  well as due to using the retractor, but we saw no injury to the bladder.  At this point, we used Babcock clamps and placed curved Heaney clamps beneath each fallopian tube on either side, and the specimens were removed and then the pedicles were tied  using 0 Vicryl suture.  Irrigation was performed again.  Hemostasis was very good.  I then used some Arista across the pelvis for further hemostasis.  We then removed all laparotomy pads, and the laparotomy pads and instruments were all removed and  counts were correct x2.  We then closed the peritoneum with a running stitch and closed the fascia as well in a running stitch.  At this point, I used the Exparel according to the manufacturer's specifications and placed subfascial and suprafascial  injections all the way around.  I then closed the subcutaneous plane in interrupted and then placed further Exparel underneath in a subcuticular fashion and closed the skin using 3-0 Vicryl.  At this point, all sponge, lap and instrument counts were  correct x2.  The urine was noted to be clearing up.  We will observe that in the recovery room.  The patient went to recovery room in stable condition, extubated.  LN/NUANCE  D:10/02/2018 T:10/02/2018 JOB:[id number]

## 2018-10-02 NOTE — Progress Notes (Signed)
Events of last night noted. Urine turned bright pink after episodes of emesis which both have now resolved. She feels better - just some cramping this morning. Minimal vaginal drainage.  BP 133/76 (BP Location: Left Arm)   Pulse 97   Temp 98.4 F (36.9 C) (Oral)   Resp 18   Ht 5\' 3"  (1.6 m)   Wt 110.3 kg   LMP 09/28/2018 (Exact Date)   SpO2 100%   BMI 43.06 kg/m  Results for orders placed or performed during the hospital encounter of 10/01/18 (from the past 24 hour(s))  CBC     Status: Abnormal   Collection Time: 10/02/18  5:14 AM  Result Value Ref Range   WBC 14.7 (H) 4.0 - 10.5 K/uL   RBC 4.06 3.87 - 5.11 MIL/uL   Hemoglobin 9.9 (L) 12.0 - 15.0 g/dL   HCT 82.932.8 (L) 56.236.0 - 13.046.0 %   MCV 80.8 80.0 - 100.0 fL   MCH 24.4 (L) 26.0 - 34.0 pg   MCHC 30.2 30.0 - 36.0 g/dL   RDW 86.514.9 78.411.5 - 69.615.5 %   Platelets 330 150 - 400 K/uL   nRBC 0.0 0.0 - 0.2 %  Basic metabolic panel     Status: Abnormal   Collection Time: 10/02/18  5:14 AM  Result Value Ref Range   Sodium 137 135 - 145 mmol/L   Potassium 3.9 3.5 - 5.1 mmol/L   Chloride 103 98 - 111 mmol/L   CO2 26 22 - 32 mmol/L   Glucose, Bld 117 (H) 70 - 99 mg/dL   BUN 9 6 - 20 mg/dL   Creatinine, Ser 2.950.62 0.44 - 1.00 mg/dL   Calcium 8.2 (L) 8.9 - 10.3 mg/dL   GFR calc non Af Amer >60 >60 mL/min   GFR calc Af Amer >60 >60 mL/min   Anion gap 8 5 - 15   Abdomen is soft and non tender Bandage removed - no drainage Scant drainage on pad and -pad replaced  IMPRESSION POD # 1  Doing well Routine care Ambulate Advance diet Remove foley later today as long as urine output removes good and urine is clear Convert to po pain medications All questions answered at bedside

## 2018-10-03 ENCOUNTER — Inpatient Hospital Stay (HOSPITAL_COMMUNITY): Payer: 59

## 2018-10-03 MED ORDER — OXYCODONE HCL 5 MG PO TABS: 5.0000 mg | ORAL_TABLET | ORAL | 0 refills | Status: DC | PRN

## 2018-10-03 MED ORDER — LACTATED RINGERS IV SOLN
INTRAVENOUS | Status: DC
  Administered 2018-10-03 – 2018-10-05 (×6): via INTRAVENOUS

## 2018-10-03 MED ORDER — BISACODYL 10 MG RE SUPP
10.0000 mg | Freq: Once | RECTAL | Status: AC
  Administered 2018-10-03: 10 mg via RECTAL
  Filled 2018-10-03: qty 1

## 2018-10-03 MED ORDER — LACTATED RINGERS IV BOLUS
500.0000 mL | Freq: Once | INTRAVENOUS | Status: AC
  Administered 2018-10-03: 500 mL via INTRAVENOUS

## 2018-10-03 MED ORDER — SODIUM CHLORIDE 0.9 % IV SOLN
1.0000 g | INTRAVENOUS | Status: DC
  Administered 2018-10-03 – 2018-10-04 (×2): 1 g via INTRAVENOUS
  Filled 2018-10-03 (×2): qty 10
  Filled 2018-10-03 (×2): qty 1

## 2018-10-03 MED ORDER — TRAMADOL HCL 50 MG PO TABS: 50.0000 mg | ORAL_TABLET | Freq: Four times a day (QID) | ORAL | 0 refills | Status: DC | PRN

## 2018-10-03 NOTE — Consult Note (Addendum)
Urology Consult Note   Requesting Attending Physician:  Marcelle OverlieGrewal, Michelle, MD Service Providing Consult: Urology  Consulting Attending: Bjorn PippinJohn Laurrie Toppin, MD   Reason for Consult:  Concern for bladder injury  HPI: Jordan Hickman is seen in consultation for reasons noted above at the request of Marcelle OverlieGrewal, Michelle, MD for evaluation of possible bladder injury.  This is a 42 y.o. female with a history of pelvic pain, menorrhagia, and cervical stenosis who underwent a TAH, LOA, and BSO on 10/01/18. During surgery, dense pelvic adhesions were noted and bladder was noted to be thin walled. No bladder injury identified. At the end of the case, light pink tinged urine was noted but it cleared easily.  On POD1, patient was doing well and foley was removed. Patient reports that since foley removal, she has had increasing abdominal pain. She was voiding without issue, but reports "abdominal burning" following voiding. She last passed flatus this AM at around 11AM. No flatus since. Due to concern for ileus and possible bladder injury causing abdominal pain, foley was replaced this afternoon. Since replacement, patient reports resolution of pain. Denies nausea. Overall feels much improved since foley replacement.    Past Medical History: Past Medical History:  Diagnosis Date  . Asthma    exercise induced  . Elevated blood pressure reading 09/24/2018  . Low iron    hx of during pregnancy   . Menorrhagia   . Sleep apnea    cpap     Past Surgical History:  Past Surgical History:  Procedure Laterality Date  . ABDOMINAL HYSTERECTOMY Bilateral 10/01/2018   Procedure: HYSTERECTOMY ABDOMINAL, bilateral salpingectomy;  Surgeon: Marcelle OverlieGrewal, Michelle, MD;  [facility];  Service: Gynecology;  Laterality: Bilateral;  . CESAREAN SECTION  12-21-99  . COLONOSCOPY  2019  . TONSILLECTOMY  1990's    Medication: Current Facility-Administered Medications  Medication Dose Route Frequency Provider  Last Rate Last Dose  . albuterol (PROVENTIL) (2.5 MG/3ML) 0.083% nebulizer solution 3 mL  3 mL Inhalation Q6H PRN Marcelle OverlieGrewal, Michelle, MD      . cefTRIAXone (ROCEPHIN) 1 g in sodium chloride 0.9 % 100 mL IVPB  1 g Intravenous Q24H Marcelle OverlieGrewal, Michelle, MD      . lactated ringers infusion   Intravenous Continuous Marcelle OverlieGrewal, Michelle, MD 125 mL/hr at 10/02/18 1836    . lactated ringers infusion   Intravenous Continuous Marcelle OverlieGrewal, Michelle, MD 125 mL/hr at 10/03/18 1711    . menthol-cetylpyridinium (CEPACOL) lozenge 3 mg  1 lozenge Oral Q2H PRN Marcelle OverlieGrewal, Michelle, MD      . oxyCODONE (Oxy IR/ROXICODONE) immediate release tablet 5-10 mg  5-10 mg Oral Q4H PRN Marcelle OverlieGrewal, Michelle, MD   10 mg at 10/03/18 2006  . traMADol (ULTRAM) tablet 50 mg  50 mg Oral Q6H PRN Marcelle OverlieGrewal, Michelle, MD        Allergies: No Known Allergies  Social History: Social History   Tobacco Use  . Smoking status: Never Smoker  . Smokeless tobacco: Never Used  Substance Use Topics  . Alcohol use: Yes    Comment: 1-2 glasses per week of bourbon , wine cooler,  . Drug use: No    Family History Family History  Problem Relation Age of Onset  . Stroke Other   . Diabetes Father   . Hypertension Father   . Sleep apnea Father        morbidly obese.   . Other Daughter        Leomyosarcoma- right bicep- in remission  . Hypertension Mother   .  Thyroid disease Mother     Review of Systems 10 systems were reviewed and are negative except as noted specifically in the HPI.  Objective   Vital signs in last 24 hours: BP 129/73 (BP Location: Left Arm)   Pulse 97   Temp 98.2 F (36.8 C) (Oral)   Resp 18   Ht 5\' 3"  (1.6 m)   Wt 110.3 kg   LMP 09/28/2018 (Exact Date)   SpO2 99%   BMI 43.06 kg/m   Physical Exam General: NAD, A&O, resting, appropriate HEENT: [place]/AT, EOMI, MMM Pulmonary: Normal work of breathing Cardiovascular: HDS, adequate peripheral perfusion Abdomen: Soft, mildly TTP, nondistended, pfannensteil incision  w/steri-strips in place GU: urine light yellow in foley tubing, no clots Extremities: warm and well perfused Neuro: Appropriate, no focal neurological deficits  Most Recent Labs: Lab Results  Component Value Date   WBC 14.7 (H) 10/02/2018   HGB 9.9 (L) 10/02/2018   HCT 32.8 (L) 10/02/2018   PLT 330 10/02/2018    Lab Results  Component Value Date   NA 137 10/02/2018   K 3.9 10/02/2018   CL 103 10/02/2018   CO2 26 10/02/2018   BUN 9 10/02/2018   CREATININE 0.62 10/02/2018   CALCIUM 8.2 (L) 10/02/2018    No results found for: INR, APTT   IMAGING: Ct Abdomen Pelvis Wo Contrast  Result Date: 10/03/2018 CLINICAL DATA:  Recent TAH/BSO.  Suspect bladder injury. EXAM: CT ABDOMEN AND PELVIS WITHOUT CONTRAST TECHNIQUE: Multidetector CT imaging of the abdomen and pelvis was performed following the standard protocol without IV contrast. Contrast material was placed into the urinary bladder through the indwelling Foley catheter for CT cystogram. COMPARISON:  None FINDINGS: Lower chest: Linear bibasilar atelectasis.  Heart is normal size. Hepatobiliary: No focal hepatic abnormality. Gallbladder unremarkable. Pancreas: No focal abnormality or ductal dilatation. Spleen: No focal abnormality.  Normal size. Adrenals/Urinary Tract: There is large volume of contrast extravasation from the urinary bladder, arising arising from the posterior wall compatible with bladder wall injury. This is best seen on sagittal image 69. No hydronephrosis. No focal renal or adrenal abnormality. Stomach/Bowel: Stomach, large and small bowel grossly unremarkable. Appendix is normal. Vascular/Lymphatic: No evidence of aneurysm or adenopathy. Reproductive: Prior hysterectomy.  No adnexal mass. Other: As noted above. Large volume contrast noted throughout the abdomen and pelvis including surrounding the liver and spleen. Soft tissue stranding and gas noted anterior to the bladder and within the anterior abdominal wall  compatible with recent hysterectomy. Musculoskeletal: No acute bony abnormality. IMPRESSION: Posterior bladder wall injury and leak with large volume contrast noted throughout the peritoneal cavity of the abdomen and pelvis. The leak can be visualized on sagittal image 69 (series 6). Bibasilar atelectasis. These results will be called to the ordering clinician or representative by the Radiologist Assistant, and communication documented in the PACS or zVision Dashboard. Electronically Signed   By: Charlett Nose M.D.   On: 10/03/2018 20:51    ------  Assessment:  42 y.o. female who is post-operative day number 2 s/p TAH, BSO, and LOA with posterior bladder wall defect.   CT cystogram reviewed and demonstrates an approximately 4 mm defect in the posterior bladder wall with thick tissue posterior to this defect. Contrast is noted to be intra-peritoneal.  Discussed management options with the patient, including immediate surgical repair vs prolonged indwelling catheter with repeat imaging to evaluate for closure. The defect is relatively small in size and patient is asymptomatic with foley catheter in place. Discussed risks and  benefits of each option with the patient. Discussed that her case is a little atypical due to location and thick tissue surrounding defect as well as resolution of symptoms with foley in place. She would like to avoid an additional surgery at this time and would like to attempt conservative management with indwelling foley catheter.   Recommendations: - Continue to monitor patient overnight for any signs of clinical worsening, including worsened abdominal pain, fevers, tachycardia or hypotension.  - NPO at midnight.  - Start cetriaxone to prevent intra-abdominal infection.  - Urology will evaluate tomorrow to determine ongoing management plan.   Thank you for this consult. Please contact the urology consult pager with any further questions/concerns.

## 2018-10-03 NOTE — Progress Notes (Signed)
Dr. Vincente PoliGrewal called to receive update on pt. MD made aware 2 RN attempted to insert foley catheter without success. Able to insert yet no urine return. Awaiting urology nurse to come and attempt. Md reported she would be here soon to insert foley. Pt reassured. Heating pad in place to upper abdomen. LR bolus infusing as ordered.

## 2018-10-03 NOTE — Progress Notes (Signed)
Dr. Vincente PoliGrewal at bedside to check on pt since last suppository. Update given. Pain and cramping continues. Voided 500-600 ml pink urine this shift. Bowel sounds hypoactive this am yet more quiet now in all 4 quads. Ambulated with little relief. See new orders entered by Dr. Vincente PoliGrewal.

## 2018-10-03 NOTE — Progress Notes (Signed)
Patient feels much better with Foley in and was sleeping comfortably.  Foley - drainage is normal and urine is pale yellow No blood or clots seen  Review of scan with Dr Leone PayorGessner - posterior bladder defect noted  She will review films with Dr. Annabell HowellsWrenn about option of continuous Foley versus immediate surgical repair   Plan reviewed with patient and she is agreeable with the plan.

## 2018-10-03 NOTE — Discharge Summary (Signed)
Admission Diagnosis: Pelvic Pain Menorrhagia Cervical Stenosis  Discharge DIagnosis: 42 year old female admitted TAH, Lysis of Adhesions and Bilateral Salpingectomy.  Surgery findings consistent with dense pelvic adhesions. POD # 1 patient was ambulating and had good pain control. She had good urine output. On the evening of POD # 0 she had a few episodes of emesis. At the time of the emesis piknk tinged urine noted in Foley and it cleared easily with fluid bolus. On POD # 1 the Foley was removed and urine output was very good and only pink tinged. By POD # 2 she was bothered by gas pain but had good flatus. She remained afebrile with stable vital signs  BP 135/77 (BP Location: Left Arm)   Pulse (!) 102   Temp 99.2 F (37.3 C) (Oral)   Resp 16   Ht 5\' 3"  (1.6 m)   Wt 110.3 kg   LMP 09/28/2018 (Exact Date)   SpO2 96%   BMI 43.06 kg/m  No results found for this or any previous visit (from the past 24 hour(s)). Abdomen is soft and non tender Incision clean , dry and intact  No results found for this or any previous visit (from the past 24 hour(s)).  Patient discharged home in in good condition Rx Oxycodone  Follow up in 1 week

## 2018-10-03 NOTE — Progress Notes (Signed)
15 minutes after catheter inserted patient feeling much better. Urine output is brisk but rosy colored I am concerned about the integrity of her bladder. After emesis after surgery bright red urine noted in her catheter And she states that she has progressively felt worse since the catheter was removed I called Urology on call (Dr. Leone PayorGessner) she will consult at bedside  And confer with Dr. Annabell HowellsWrenn and contact me with plan  Patient and her spouse were updated on plan of care  All questions are answered

## 2018-10-03 NOTE — Progress Notes (Signed)
Dr. Vincente PoliGrewal at bedside. MD successfully inserted  #14 FR foley catheter with pink, rosy colored urine return. Pt reassured. Pain easing some.

## 2018-10-03 NOTE — Progress Notes (Signed)
Patient reports pain - cannot pas gas.  Urine output good   BP 130/74 (BP Location: Left Arm)   Pulse (!) 101   Temp 98.9 F (37.2 C) (Oral)   Resp 16   Ht 5\' 3"  (1.6 m)   Wt 110.3 kg   LMP 09/28/2018 (Exact Date)   SpO2 95%   BMI 43.06 kg/m   Abdomen slightly distended  Bowel sounds good  Impression: Post op ileus  Plan; NPO except ice chips LR bolus with LR Insert Foley  Cancel discharge

## 2018-10-03 NOTE — Progress Notes (Signed)
Dr Vincente PoliGrewal aware via phone pt c/o of severe cramping and gas pain. Medicated, walking, and small heat packs helping. No results noted from am suppository ordered and given. See new orders received to repeat Dulcolax suppository and K pad.

## 2018-10-04 LAB — CBC
HCT: 33.5 % — ABNORMAL LOW (ref 36.0–46.0)
Hemoglobin: 10 g/dL — ABNORMAL LOW (ref 12.0–15.0)
MCH: 23.8 pg — AB (ref 26.0–34.0)
MCHC: 29.9 g/dL — ABNORMAL LOW (ref 30.0–36.0)
MCV: 79.6 fL — AB (ref 80.0–100.0)
Platelets: 417 10*3/uL — ABNORMAL HIGH (ref 150–400)
RBC: 4.21 MIL/uL (ref 3.87–5.11)
RDW: 14.9 % (ref 11.5–15.5)
WBC: 13.2 10*3/uL — ABNORMAL HIGH (ref 4.0–10.5)
nRBC: 0 % (ref 0.0–0.2)

## 2018-10-04 LAB — BASIC METABOLIC PANEL
Anion gap: 11 (ref 5–15)
BUN: 6 mg/dL (ref 6–20)
CO2: 26 mmol/L (ref 22–32)
Calcium: 8.2 mg/dL — ABNORMAL LOW (ref 8.9–10.3)
Chloride: 99 mmol/L (ref 98–111)
Creatinine, Ser: 0.78 mg/dL (ref 0.44–1.00)
GFR calc Af Amer: >60 mL/min (ref >60)
GFR calc non Af Amer: >60 mL/min (ref >60)
Glucose, Bld: 99 mg/dL (ref 70–99)
Potassium: 3.7 mmol/L (ref 3.5–5.1)
Sodium: 136 mmol/L (ref 135–145)

## 2018-10-04 MED ORDER — DOCUSATE SODIUM 100 MG PO CAPS
100.0000 mg | ORAL_CAPSULE | Freq: Two times a day (BID) | ORAL | Status: DC
  Administered 2018-10-04 – 2018-10-05 (×3): 100 mg via ORAL
  Filled 2018-10-04 (×3): qty 1

## 2018-10-04 MED ORDER — OXYBUTYNIN CHLORIDE 5 MG PO TABS
5.0000 mg | ORAL_TABLET | Freq: Three times a day (TID) | ORAL | Status: DC | PRN
  Administered 2018-10-04 – 2018-10-05 (×5): 5 mg via ORAL
  Filled 2018-10-04 (×5): qty 1

## 2018-10-04 MED ORDER — SENNA 8.6 MG PO TABS
1.0000 | ORAL_TABLET | Freq: Every day | ORAL | Status: DC
  Administered 2018-10-04: 8.6 mg via ORAL
  Filled 2018-10-04: qty 1

## 2018-10-04 MED ORDER — DOCUSATE SODIUM 100 MG PO CAPS: 100.0000 mg | ORAL_CAPSULE | Freq: Two times a day (BID) | ORAL | Status: DC

## 2018-10-04 NOTE — Progress Notes (Addendum)
Urology Progress Note   3 Days Post-Op  Subjective: NAEON. Patient reports continued improvement after foley catheter was placed last night. Pain controlled, ambulating. Denies nausea.   Objective: Vital signs in last 24 hours: Temp:  [98.2 F (36.8 C)-99.7 F (37.6 C)] 99.7 F (37.6 C) (12/15 0551) Pulse Rate:  [97-111] 111 (12/15 0551) Resp:  [16-18] 18 (12/15 0551) BP: (129-148)/(73-85) 148/85 (12/15 0551) SpO2:  [95 %-99 %] 95 % (12/15 0551)  Intake/Output from previous day: 12/14 0701 - 12/15 0700 In: 2120.4 [P.O.:360; I.V.:1187.5; IV Piggyback:572.9] Out: 3175 [Urine:3175] Intake/Output this shift: No intake/output data recorded.  Physical Exam:  General: Alert and oriented CV: HDS Lungs: NWOB Abdomen: Soft, appropriately tender, non-distended GU: Foley in place draining clear yellow urine  Ext: NT, No erythema  Lab Results: Recent Labs    10/02/18 0514 10/04/18 0807  HGB 9.9* 10.0*  HCT 32.8* 33.5*   BMET Recent Labs    10/02/18 0514 10/04/18 0807  NA 137 136  K 3.9 3.7  CL 103 99  CO2 26 26  GLUCOSE 117* 99  BUN 9 6  CREATININE 0.62 0.78  CALCIUM 8.2* 8.2*     Studies/Results: Ct Abdomen Pelvis Wo Contrast  Result Date: 10/03/2018 CLINICAL DATA:  Recent TAH/BSO.  Suspect bladder injury. EXAM: CT ABDOMEN AND PELVIS WITHOUT CONTRAST TECHNIQUE: Multidetector CT imaging of the abdomen and pelvis was performed following the standard protocol without IV contrast. Contrast material was placed into the urinary bladder through the indwelling Foley catheter for CT cystogram. COMPARISON:  None FINDINGS: Lower chest: Linear bibasilar atelectasis.  Heart is normal size. Hepatobiliary: No focal hepatic abnormality. Gallbladder unremarkable. Pancreas: No focal abnormality or ductal dilatation. Spleen: No focal abnormality.  Normal size. Adrenals/Urinary Tract: There is large volume of contrast extravasation from the urinary bladder, arising arising from the  posterior wall compatible with bladder wall injury. This is best seen on sagittal image 69. No hydronephrosis. No focal renal or adrenal abnormality. Stomach/Bowel: Stomach, large and small bowel grossly unremarkable. Appendix is normal. Vascular/Lymphatic: No evidence of aneurysm or adenopathy. Reproductive: Prior hysterectomy.  No adnexal mass. Other: As noted above. Large volume contrast noted throughout the abdomen and pelvis including surrounding the liver and spleen. Soft tissue stranding and gas noted anterior to the bladder and within the anterior abdominal wall compatible with recent hysterectomy. Musculoskeletal: No acute bony abnormality. IMPRESSION: Posterior bladder wall injury and leak with large volume contrast noted throughout the peritoneal cavity of the abdomen and pelvis. The leak can be visualized on sagittal image 69 (series 6). Bibasilar atelectasis. These results will be called to the ordering clinician or representative by the Radiologist Assistant, and communication documented in the PACS or zVision Dashboard. Electronically Signed   By: Charlett NoseKevin  Dover M.D.   On: 10/03/2018 20:51    Assessment/Plan:  42 y.o. female s/p TAH, BSO, and LOA on 10/01/18 with posterior bladder wall defect diagnosed on 10/03/18. She continues to be clinically stable without fevers and with significantly improved abdominal exam following foley catheter placement. Denies significant abdominal pain. She is a little tachycardic this AM, but feels well without increased leukocytosis.   Given patient's clinical stability, size and location of the bladder defect which is considered amenable to healing with indwelling foley catheter, and patient's desire to avoid an additional surgery, will plan for conservative management with indwelling foley catheter.  - Continue foley catheter to drainage for approximately 3 weeks. - Monitor for any signs of clinical worsening, including worsened abdominal pain,  fevers,  tachycardia or hypotension.  - Continue 5 days of empiric po antibiotic. - Discharge on oxybutynin and stool softeners.     LOS: 3 days   Elyn Aquas 10/04/2018, 9:37 AM I have seen pt w/ Drs. Gessner/Grewal. I agree with the above assessment/plan

## 2018-10-04 NOTE — Progress Notes (Signed)
Patient has had a good day. Sitting up and eating regular diet. Catheter still draining - pink tinged  BP 135/90 (BP Location: Left Arm)   Pulse (!) 116   Temp 98.2 F (36.8 C) (Oral)   Resp 20   Ht 5\' 3"  (1.6 m)   Wt 110.3 kg   LMP 09/28/2018 (Exact Date)   SpO2 100%   BMI 43.06 kg/m  Results for orders placed or performed during the hospital encounter of 10/01/18 (from the past 24 hour(s))  CBC     Status: Abnormal   Collection Time: 10/04/18  8:07 AM  Result Value Ref Range   WBC 13.2 (H) 4.0 - 10.5 K/uL   RBC 4.21 3.87 - 5.11 MIL/uL   Hemoglobin 10.0 (L) 12.0 - 15.0 g/dL   HCT 16.133.5 (L) 09.636.0 - 04.546.0 %   MCV 79.6 (L) 80.0 - 100.0 fL   MCH 23.8 (L) 26.0 - 34.0 pg   MCHC 29.9 (L) 30.0 - 36.0 g/dL   RDW 40.914.9 81.111.5 - 91.415.5 %   Platelets 417 (H) 150 - 400 K/uL   nRBC 0.0 0.0 - 0.2 %  Basic metabolic panel     Status: Abnormal   Collection Time: 10/04/18  8:07 AM  Result Value Ref Range   Sodium 136 135 - 145 mmol/L   Potassium 3.7 3.5 - 5.1 mmol/L   Chloride 99 98 - 111 mmol/L   CO2 26 22 - 32 mmol/L   Glucose, Bld 99 70 - 99 mg/dL   BUN 6 6 - 20 mg/dL   Creatinine, Ser 7.820.78 0.44 - 1.00 mg/dL   Calcium 8.2 (L) 8.9 - 10.3 mg/dL   GFR calc non Af Amer >60 >60 mL/min   GFR calc Af Amer >60 >60 mL/min   Anion gap 11 5 - 15   Abdomen is soft and non tender Incision is clean and dry and intact  IMPRESSION: POD # 3 TAH and BS Cystotomy  Appreciate Urology consultation Continue indwelling catheter Probable discharge home tomorrow morning ] Plan reviewed with patient and her spouse

## 2018-10-04 NOTE — Progress Notes (Signed)
Leg bag teaching done. Instructed how to apply leg bag, how to apply large foley bag at night, and how to perform daily foley/peri care. Pt and husband verbalized understanding of instructions. Supplies provided for discharge tomorrow.

## 2018-10-04 NOTE — Progress Notes (Signed)
POD # 3  Overnight, foley has been draining very well. She now feels much better just some bladder spasms.  Dr. Jeffie Pollock met with patient and they discussed Foley versus surgical repair and she definitely wants to wear the Foley  Dr. Diona Fanti is in room this morning with patient  BP (!) 148/85 (BP Location: Left Arm)   Pulse (!) 111   Temp 99.7 F (37.6 C) (Oral)   Resp 18   Ht _0  (1.6 m)   Wt 110.3 kg   LMP 09/28/2018 (Exact Date)   SpO2 95%   BMI 43.06 kg/m  Results for orders placed or performed during the hospital encounter of 10/01/18 (from the past 24 hour(s))  CBC     Status: Abnormal   Collection Time: 10/04/18  8:07 AM  Result Value Ref Range   WBC 13.2 (H) 4.0 - 10.5 K/uL   RBC 4.21 3.87 - 5.11 MIL/uL   Hemoglobin 10.0 (L) 12.0 - 15.0 g/dL   HCT 33.5 (L) 36.0 - 46.0 %   MCV 79.6 (L) 80.0 - 100.0 fL   MCH 23.8 (L) 26.0 - 34.0 pg   MCHC 29.9 (L) 30.0 - 36.0 g/dL   RDW 14.9 11.5 - 15.5 %   Platelets 417 (H) 150 - 400 K/uL   nRBC 0.0 0.0 - 0.2 %   Abdomen is soft and non tender Incision clean and dry  POD # 3 TAH, BS, LOA Cystotomy   I think she is clinically improved since Foley reinserted Ambulate Advance diet Leg bag training today Start Oxybutynin and Colace Continue antibiotics Plan of care reviewed with patient and Urology

## 2018-10-05 MED ORDER — DOCUSATE SODIUM 100 MG PO CAPS: 100.0000 mg | ORAL_CAPSULE | Freq: Two times a day (BID) | ORAL | 0 refills | Status: DC

## 2018-10-05 MED ORDER — BISACODYL 10 MG RE SUPP
10.0000 mg | Freq: Every day | RECTAL | Status: DC | PRN
  Administered 2018-10-05: 10 mg via RECTAL
  Filled 2018-10-05: qty 1

## 2018-10-05 MED ORDER — OXYBUTYNIN CHLORIDE 5 MG PO TABS: 5.0000 mg | ORAL_TABLET | Freq: Three times a day (TID) | ORAL | 0 refills | Status: DC | PRN

## 2018-10-05 MED ORDER — FLEET ENEMA 7-19 GM/118ML RE ENEM
1.0000 | ENEMA | Freq: Once | RECTAL | Status: AC
  Administered 2018-10-05: 1 via RECTAL
  Filled 2018-10-05: qty 1

## 2018-10-05 NOTE — Discharge Summary (Signed)
Admission Diagnosis: Pelvic Pain Menorrhagia  Cervical Stenosis  Discharge Diagnosis: Same  Dense Pelvic Adhesions Cystotomy  Hospital Course: See previous discharge summary.  Patient was doing fine until after catheter removed on POD # 1. After that, she progressively developed pain and discomfort.  She was originally passing a small amount of flatus but by POD # 2 she was not and an ileus was developing. By afternoon of POD # 2 , I became concerned and reinserted Foley. The urine was rose colored and at that point I was concerned about the integrity of her bladder. I consulted Urology and a CT cystogram revealed a 4 mm defect posterior low bladder near trigone . That was in region of very dense adhesions on the cervix. After consultation with Urology, it was felt that continuous drainage with the Foley would be desirable versus immediate surgical repair.  Shortly, after the Foley was inserted the patient began to clinically improve and by the next day she was ambulating well. Her WBC and temp always remained normal.  By POD # 4 she was tolerating regular diet, passing flatus and had a small BM, and the urine was very good and clear.  She was discharged home on POD # 4 with pain medicines and antibiotics.  She was trained on proper use of leg bag.  She will follow up with me next week and Urology in 3 weeks for CT urogram.

## 2018-10-05 NOTE — Progress Notes (Signed)
POD # 4  Doing well. Rested last night. Urine remains clear. Minimal pain. Has not passed flatus Eating small amount of regular diet  BP (!) 146/85 (BP Location: Right Arm)   Pulse 98   Temp 98.4 F (36.9 C) (Oral)   Resp 17   Ht 5\' 3"  (1.6 m)   Wt 110.3 kg   LMP 09/28/2018 (Exact Date)   SpO2 97%   BMI 43.06 kg/m  No results found for this or any previous visit (from the past 24 hour(s)). Abdomen soft and non tender Non distended Urine output good amount and clear yellow  IMPRESSION: POD # 4   PLAN: Patient doing very well Dulcolax suppository Can go home when passing flatus

## 2018-10-05 NOTE — Progress Notes (Signed)
Patient still without flatus Ambulating now  BP (!) 146/85 (BP Location: Right Arm)   Pulse 98   Temp 98.4 F (36.9 C) (Oral)   Resp 17   Ht 5\' 3"  (1.6 m)   Wt 110.3 kg   LMP 09/28/2018 (Exact Date)   SpO2 97%   BMI 43.06 kg/m   Abdomen is soft and non tender  Will administer FLEET Enema  Ambulate  Advance diet

## 2018-10-05 NOTE — Progress Notes (Signed)
Discharge instructions given to pt and questions answered.  IV d/c.  Instructed pt on converting foley bag to leg bag and back to foley bag at HS.  Pt demonstrated and voiced understanding.

## 2018-10-05 NOTE — Discharge Instructions (Signed)
Indwelling Urinary Catheter Insertion, Care After °This sheet gives you information about how to care for yourself after your procedure. Your health care provider may also give you more specific instructions. If you have problems or questions, contact your health care provider. °What can I expect after the procedure? °After the procedure, it is common to have: °· Slight discomfort around your urethra where the catheter enters your body. ° °Follow these instructions at home: °· Keep the drainage bag at or below the level of your bladder. Doing this ensures that urine can only drain out, not back into your body. °· Secure the catheter tubing and drainage bag to your leg or thigh to keep it from moving. °· Check the catheter tubing regularly to make sure there are no kinks or blockages. °· Take showers daily to keep the catheter clean. Do not take a bath. °· Do not pull on your catheter or try to remove it. °· Disconnect the tubing and drainage bag as little as possible. °· Empty the drainage bag every 2-4 hours, or more often if needed. Do not let the bag get completely full. °· Wash your hands with soap and water before and after touching the catheter, tubing, or drainage bag. °· Do not let the drainage bag or catheter tubing touch the floor. °· Drink enough fluids to keep your urine clear or pale yellow, or as told by your health care provider. °Contact a health care provider if: °· Urine stops flowing into the drainage bag. °· You feel pain or pressure in the bladder area. °· You have back pain. °· Your catheter gets clogged. °· Your catheter starts to leak. °· Your urine looks cloudy. °· Your drainage bag or tubing looks dirty. °· You notice a bad smell when emptying your drainage bag. °Get help right away if: °· You have a fever or chills. °· You have severe pain in your back or your lower abdomen. °· You have warmth, redness, swelling, or pain in the urethra area. °· You notice blood in your urine. °· Your  catheter gets pulled out. °Summary °· Do not pull on your catheter or try to remove it. °· Keep the drainage bag at or below the level of your bladder, but do not let the drainage bag or catheter tubing touch the floor. °· Wash your hands with soap and water before and after touching the catheter, tubing, or drainage bag. °· Contact your health care provider if you have a fever, chills, or any other signs of infection. °This information is not intended to replace advice given to you by your health care provider. Make sure you discuss any questions you have with your health care provider. °Document Released: 11/16/2016 Document Revised: 11/16/2016 Document Reviewed: 11/16/2016 °Elsevier Interactive Patient Education © 2018 [employer] ° °

## 2018-10-05 NOTE — Progress Notes (Signed)
4 Days Post-Op  Subjective: Jordan Hickman is doing well without recurrent diffuse abdominal pain since replacement of the foley.  She has had a good urine output and the urine remains clear.  She has some incisional pain but no nausea or fever.  She has not had a BM or significant flatus.  ROS:  Review of Systems  Respiratory:       Hiccups  Gastrointestinal: Positive for abdominal pain.  All other systems reviewed and are negative.   Anti-infectives: Anti-infectives (From admission, onward)   Start     Dose/Rate Route Frequency Ordered Stop   10/03/18 2200  cefTRIAXone (ROCEPHIN) 1 g in sodium chloride 0.9 % 100 mL IVPB     1 g 200 mL/hr over 30 Minutes Intravenous Every 24 hours 10/03/18 2154     10/01/18 1000  cefoTEtan (CEFOTAN) 2 g in sodium chloride 0.9 % 100 mL IVPB  Status:  Discontinued     2 g 200 mL/hr over 30 Minutes Intravenous Every 12 hours 10/01/18 0604 10/01/18 1341      Current Facility-Administered Medications  Medication Dose Route Frequency Provider Last Rate Last Dose  . albuterol (PROVENTIL) (2.5 MG/3ML) 0.083% nebulizer solution 3 mL  3 mL Inhalation Q6H PRN Marcelle OverlieGrewal, Michelle, MD      . cefTRIAXone (ROCEPHIN) 1 g in sodium chloride 0.9 % 100 mL IVPB  1 g Intravenous Q24H Marcelle OverlieGrewal, Michelle, MD   Stopped at 10/04/18 2145  . docusate sodium (COLACE) capsule 100 mg  100 mg Oral BID Elyn AquasGessner, Kathryn H, MD   100 mg at 10/04/18 2113  . lactated ringers infusion   Intravenous Continuous Marcelle OverlieGrewal, Michelle, MD 125 mL/hr at 10/02/18 1836    . lactated ringers infusion   Intravenous Continuous Marcelle OverlieGrewal, Michelle, MD 125 mL/hr at 10/05/18 0600    . menthol-cetylpyridinium (CEPACOL) lozenge 3 mg  1 lozenge Oral Q2H PRN Marcelle OverlieGrewal, Michelle, MD      . oxybutynin (DITROPAN) tablet 5 mg  5 mg Oral Q8H PRN Elyn AquasGessner, Kathryn H, MD   5 mg at 10/05/18 0232  . oxyCODONE (Oxy IR/ROXICODONE) immediate release tablet 5-10 mg  5-10 mg Oral Q4H PRN Marcelle OverlieGrewal, Michelle, MD   10 mg at 10/05/18 0539  . senna  (SENOKOT) tablet 8.6 mg  1 tablet Oral QHS Elyn AquasGessner, Kathryn H, MD   8.6 mg at 10/04/18 2113  . traMADol (ULTRAM) tablet 50 mg  50 mg Oral Q6H PRN Marcelle OverlieGrewal, Michelle, MD   50 mg at 10/04/18 1841     Objective: Vital signs in last 24 hours: Temp:  [98.2 F (36.8 C)-99.5 F (37.5 C)] 98.4 F (36.9 C) (12/16 0605) Pulse Rate:  [98-116] 98 (12/16 0605) Resp:  [17-20] 17 (12/16 0605) BP: (135-165)/(85-94) 146/85 (12/16 0605) SpO2:  [95 %-100 %] 97 % (12/16 0605)  Intake/Output from previous day: 12/15 0701 - 12/16 0700 In: 3534.9 [P.O.:720; I.V.:2714.9; IV Piggyback:100] Out: 2025 [Urine:2025] Intake/Output this shift: No intake/output data recorded.   Physical Exam Vitals signs reviewed.  Constitutional:      Appearance: Normal appearance. She is obese.  Cardiovascular:     Rate and Rhythm: Normal rate and regular rhythm.  Pulmonary:     Effort: Pulmonary effort is normal. No respiratory distress.  Abdominal:     Palpations: Abdomen is soft.     Tenderness: There is abdominal tenderness (mild in LLQ).  Neurological:     Mental Status: She is alert.     Lab Results:  Recent Labs    10/04/18 [phone]  WBC 13.2*  HGB 10.0*  HCT 33.5*  PLT 417*   BMET Recent Labs    10/04/18 0807  NA 136  K 3.7  CL 99  CO2 26  GLUCOSE 99  BUN 6  CREATININE 0.78  CALCIUM 8.2*   PT/INR No results for input(s): LABPROT, INR in the last 72 hours. ABG No results for input(s): PHART, HCO3 in the last 72 hours.  Invalid input(s): PCO2, PO2  Studies/Results: Ct Abdomen Pelvis Wo Contrast  Result Date: 10/03/2018 CLINICAL DATA:  Recent TAH/BSO.  Suspect bladder injury. EXAM: CT ABDOMEN AND PELVIS WITHOUT CONTRAST TECHNIQUE: Multidetector CT imaging of the abdomen and pelvis was performed following the standard protocol without IV contrast. Contrast material was placed into the urinary bladder through the indwelling Foley catheter for CT cystogram. COMPARISON:  None FINDINGS: Lower  chest: Linear bibasilar atelectasis.  Heart is normal size. Hepatobiliary: No focal hepatic abnormality. Gallbladder unremarkable. Pancreas: No focal abnormality or ductal dilatation. Spleen: No focal abnormality.  Normal size. Adrenals/Urinary Tract: There is large volume of contrast extravasation from the urinary bladder, arising arising from the posterior wall compatible with bladder wall injury. This is best seen on sagittal image 69. No hydronephrosis. No focal renal or adrenal abnormality. Stomach/Bowel: Stomach, large and small bowel grossly unremarkable. Appendix is normal. Vascular/Lymphatic: No evidence of aneurysm or adenopathy. Reproductive: Prior hysterectomy.  No adnexal mass. Other: As noted above. Large volume contrast noted throughout the abdomen and pelvis including surrounding the liver and spleen. Soft tissue stranding and gas noted anterior to the bladder and within the anterior abdominal wall compatible with recent hysterectomy. Musculoskeletal: No acute bony abnormality. IMPRESSION: Posterior bladder wall injury and leak with large volume contrast noted throughout the peritoneal cavity of the abdomen and pelvis. The leak can be visualized on sagittal image 69 (series 6). Bibasilar atelectasis. These results will be called to the ordering clinician or representative by the Radiologist Assistant, and communication documented in the PACS or zVision Dashboard. Electronically Signed   By: Charlett Nose M.D.   On: 10/03/2018 20:51     Assessment and Plan: Posterior wall bladder injury with intraperitoneal extravasation doing well with foley in place.  She will need f/u in the office in about 3 weeks for evaluation and a cystogram will be arranged after that visit.       LOS: 4 days    Bjorn Pippin 10/05/2018 [phone] ID: Lequita Asal, female   DOB: [birth year], 42 y.o.   MRN: [id number]

## 2018-10-08 ENCOUNTER — Encounter: Payer: Self-pay | Admitting: Family Medicine

## 2018-10-20 ENCOUNTER — Other Ambulatory Visit (HOSPITAL_COMMUNITY): Payer: Self-pay | Admitting: Urology

## 2018-10-20 ENCOUNTER — Ambulatory Visit (HOSPITAL_COMMUNITY)
Admission: RE | Admit: 2018-10-20 | Discharge: 2018-10-20 | Disposition: A | Discharge status: Home / Self Care | Payer: 59 | Source: Ambulatory Visit | Attending: Urology | Admitting: Urology

## 2018-10-20 DIAGNOSIS — S3729XS Other injury of bladder, sequela: Secondary | ICD-10-CM

## 2018-10-20 MED ORDER — IOTHALAMATE MEGLUMINE 17.2 % UR SOLN
250.0000 mL | Freq: Once | URETHRAL | Status: AC | PRN
  Administered 2018-10-20: 250 mL via INTRAVESICAL

## 2018-10-29 ENCOUNTER — Encounter: Payer: Self-pay | Admitting: Family Medicine

## 2018-11-18 ENCOUNTER — Encounter: Payer: 59 | Admitting: Family Medicine

## 2018-12-29 IMAGING — CT CT ABD-PELV W/O CM
2 of 5 series · 16 of 46 positions shown, 18 images · non-contrast
Comparison: None

CLINICAL DATA: Recent TAH/BSO.  Suspect bladder injury.

EXAM:
CT ABDOMEN AND PELVIS WITHOUT CONTRAST
TECHNIQUE: Multidetector CT imaging of the abdomen and pelvis was performed
following the standard protocol without IV contrast. Contrast
material was placed into the urinary bladder through the indwelling
Foley catheter for CT cystogram.

[Series 3: thins · axial · 0.83mm/px · z∈[+1178,+1637]mm · 13 of 1034 slices shown, 15 images]
[im 77/1034  soft-tissue]
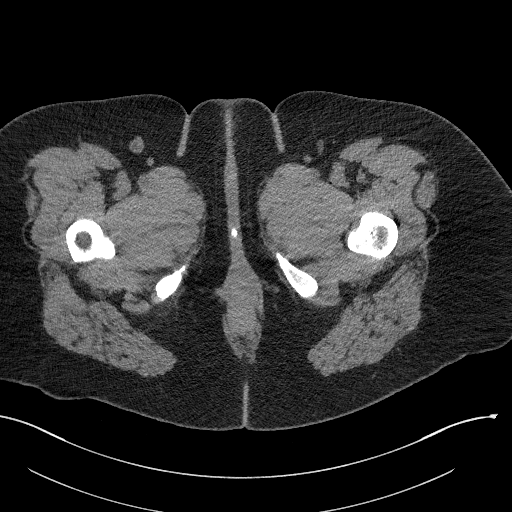
[im 77/1034  bone]
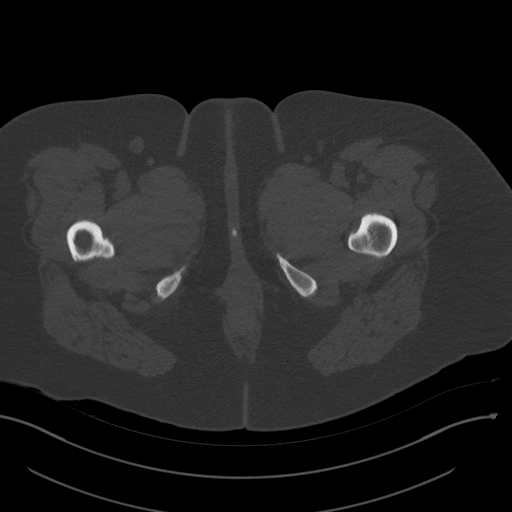
[im 154/1034  soft-tissue]
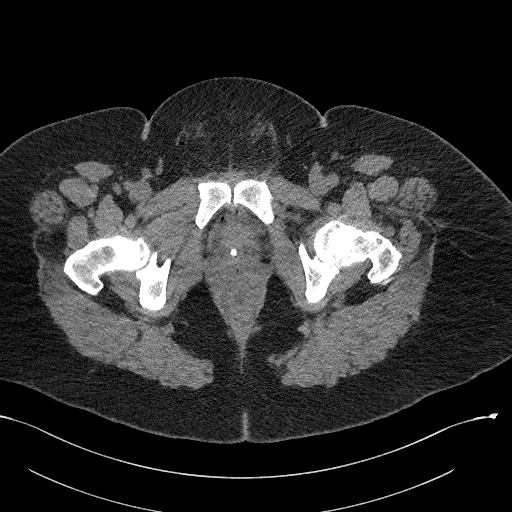
[im 230/1034  soft-tissue]
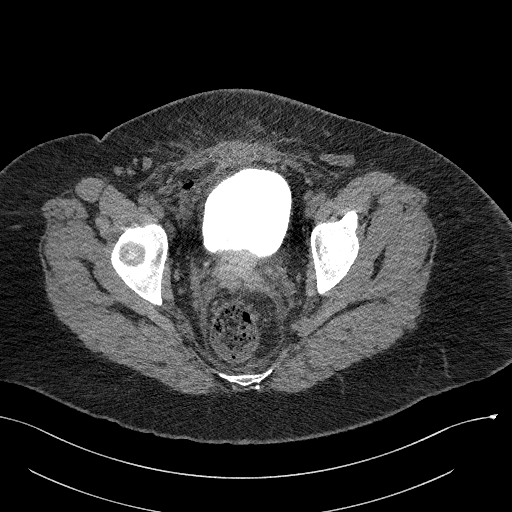
[im 307/1034  soft-tissue]
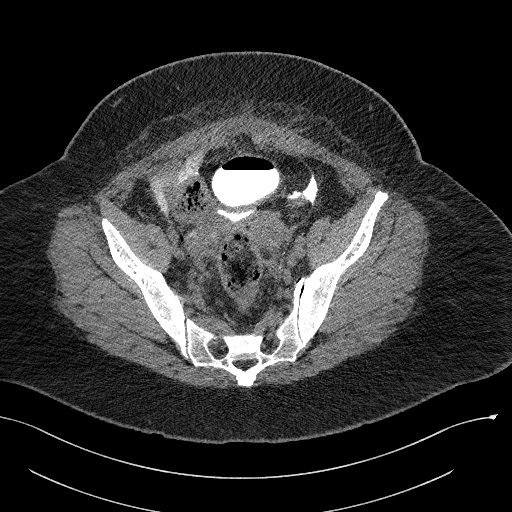
[im 383/1034  soft-tissue]
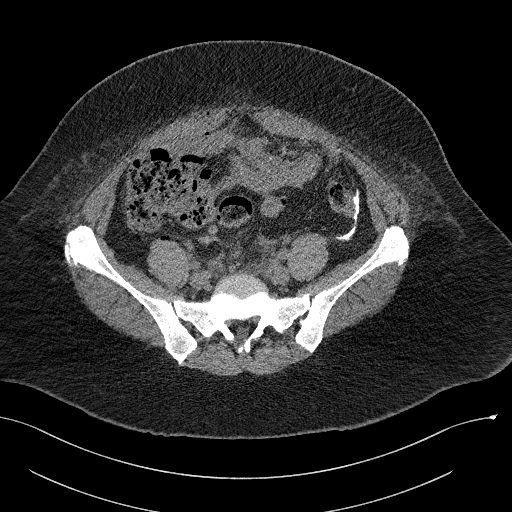
[im 460/1034  soft-tissue]
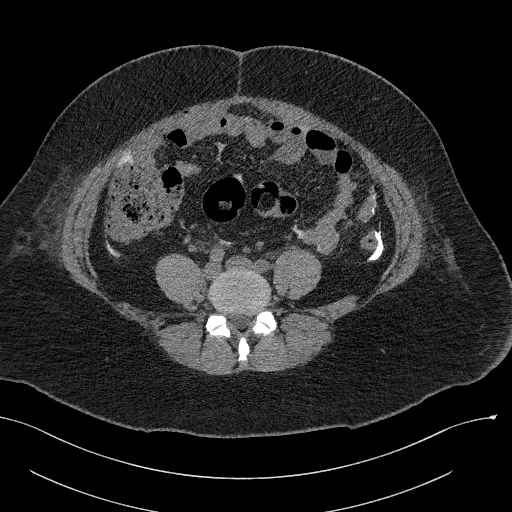
[im 536/1034  soft-tissue]
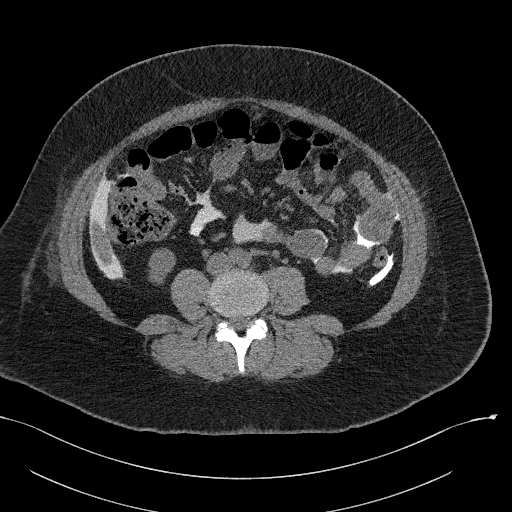
[im 613/1034  soft-tissue]
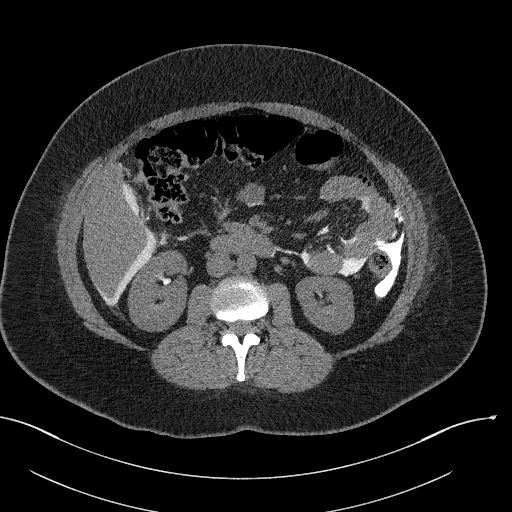
[im 689/1034  soft-tissue]
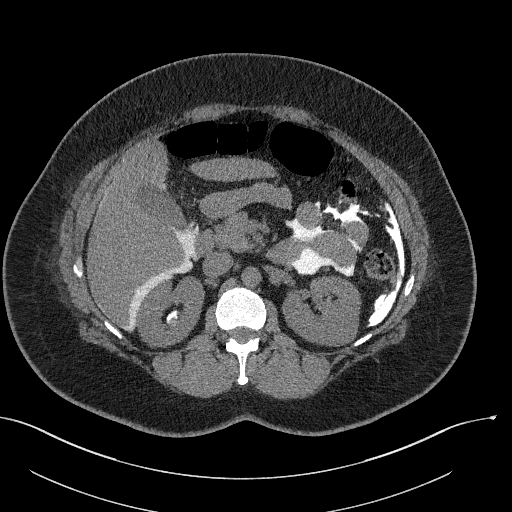
[im 689/1034  bone]
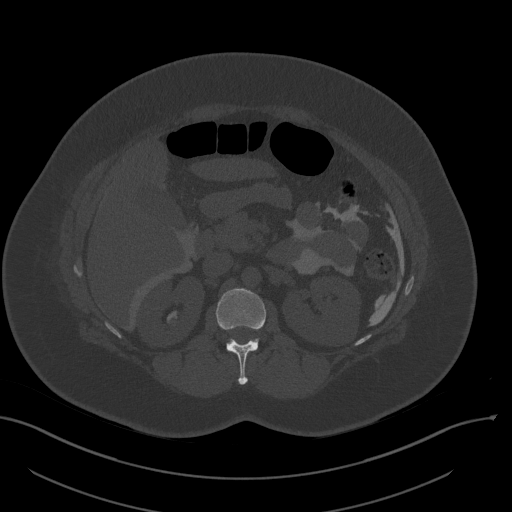
[im 766/1034  soft-tissue]
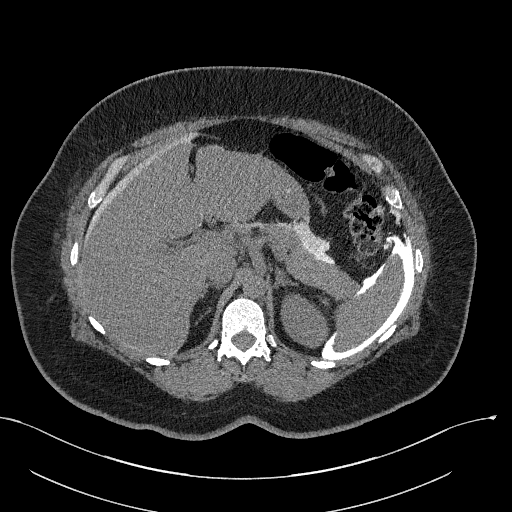
[im 842/1034  soft-tissue]
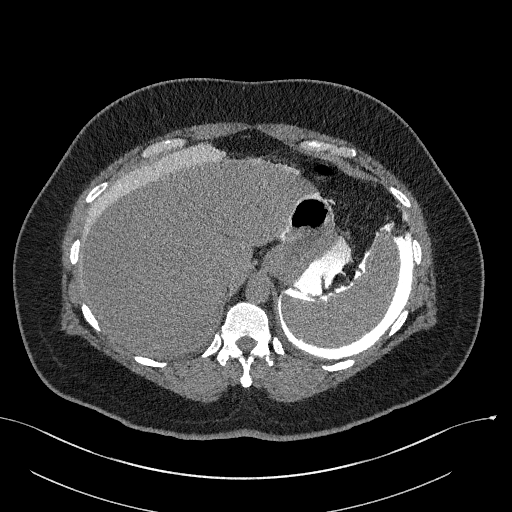
[im 919/1034  soft-tissue]
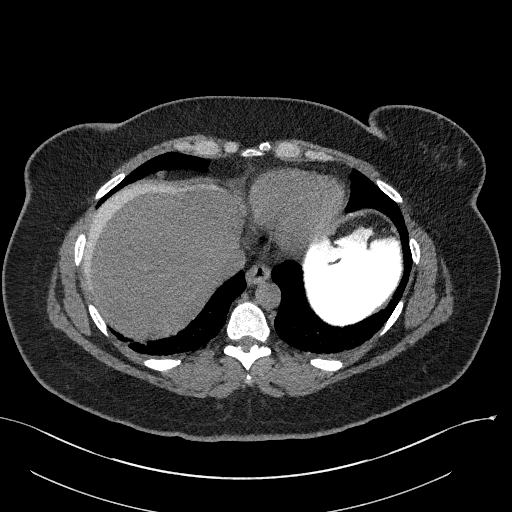
[im 995/1034  soft-tissue]
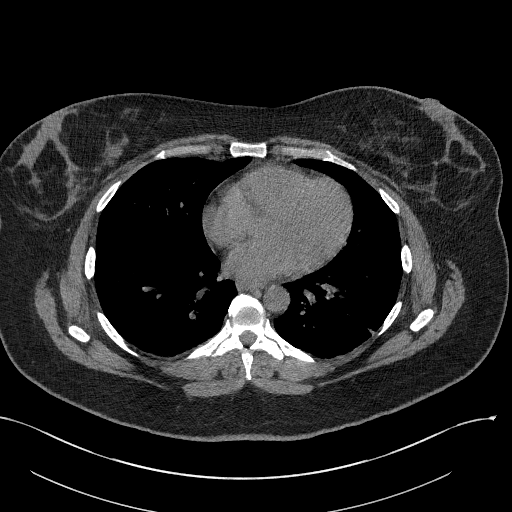

[Series 5: coronal st · coronal · 0.89mm/px · 3 of 114 slices shown]
[im 38/114  soft-tissue]
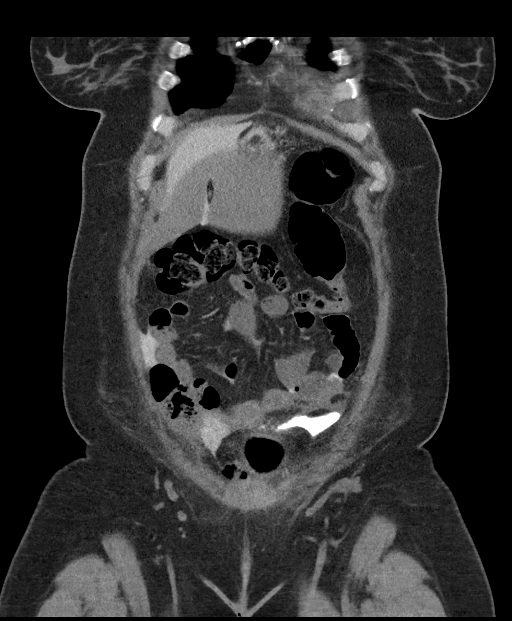
[im 51/114  soft-tissue]
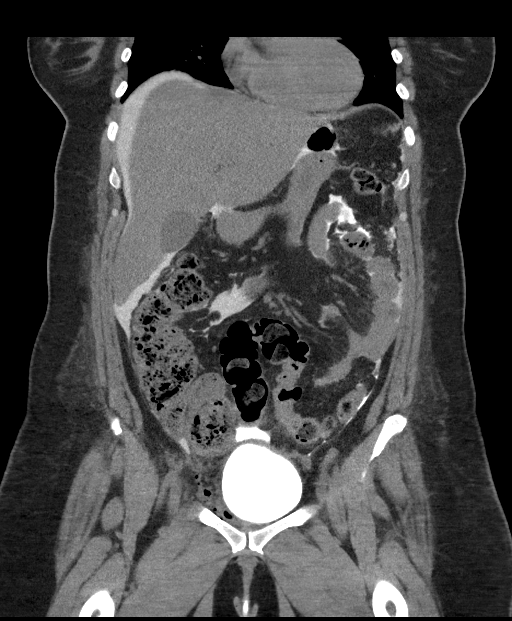
[im 63/114  soft-tissue]
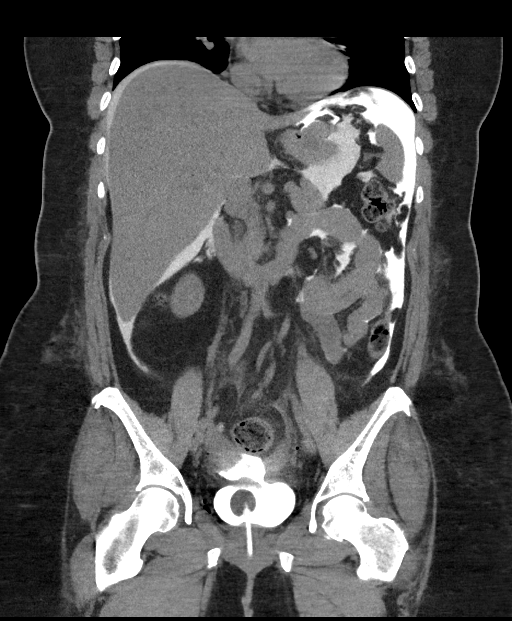

[16 of 46 positions shown; findings below may reference images not displayed]

FINDINGS: Lower chest: Linear bibasilar atelectasis.  Heart is normal size.

Hepatobiliary: No focal hepatic abnormality. Gallbladder
unremarkable.

Pancreas: No focal abnormality or ductal dilatation.

Spleen: No focal abnormality.  Normal size.

Adrenals/Urinary Tract: There is large volume of contrast
extravasation from the urinary bladder, arising arising from the
posterior wall compatible with bladder wall injury. This is best
seen on sagittal image 69. No hydronephrosis. No focal renal or
adrenal abnormality.

Stomach/Bowel: Stomach, large and small bowel grossly unremarkable.
Appendix is normal.

Vascular/Lymphatic: No evidence of aneurysm or adenopathy.

Reproductive: Prior hysterectomy.  No adnexal mass.

Other: As noted above. Large volume contrast noted throughout the
abdomen and pelvis including surrounding the liver and spleen. Soft
tissue stranding and gas noted anterior to the bladder and within
the anterior abdominal wall compatible with recent hysterectomy.

Musculoskeletal: No acute bony abnormality.
IMPRESSION: Posterior bladder wall injury and leak with large volume contrast
noted throughout the peritoneal cavity of the abdomen and pelvis.
The leak can be visualized on sagittal image 69 (series 6).

Bibasilar atelectasis.

These results will be called to the ordering clinician or
representative by the Radiologist Assistant, and communication
documented in the PACS or zVision Dashboard.

## 2021-02-21 DIAGNOSIS — J302 Other seasonal allergic rhinitis: Secondary | ICD-10-CM | POA: Insufficient documentation

## 2021-10-23 ENCOUNTER — Ambulatory Visit: Payer: 59 | Admitting: Family Medicine

## 2021-10-23 ENCOUNTER — Encounter: Payer: Self-pay | Admitting: Family Medicine

## 2021-10-23 ENCOUNTER — Other Ambulatory Visit: Payer: Self-pay

## 2021-10-23 VITALS — BP 162/84 | HR 85 | Temp 98.4°F | Resp 18 | Ht 63.0 in | Wt 260.0 lb

## 2021-10-23 DIAGNOSIS — J22 Unspecified acute lower respiratory infection: Secondary | ICD-10-CM | POA: Diagnosis not present

## 2021-10-23 DIAGNOSIS — J4521 Mild intermittent asthma with (acute) exacerbation: Secondary | ICD-10-CM | POA: Diagnosis not present

## 2021-10-23 MED ORDER — AZITHROMYCIN 250 MG PO TABS: ORAL_TABLET | ORAL | 0 refills | Status: AC

## 2021-10-23 MED ORDER — PREDNISONE 20 MG PO TABS: 40.0000 mg | ORAL_TABLET | Freq: Every day | ORAL | 0 refills | Status: DC

## 2021-10-23 NOTE — Progress Notes (Signed)
Acute Office Visit  Subjective:    Patient ID: Jordan Hickman, female    DOB: [birth year], 46 y.o.   MRN: [id number]  Chief Complaint  Patient presents with   Chest Congestion     Since 10/11/21, dry cough, fatigue     HPI Patient is in today for chest congestion and cough since 10/11/2021.  She does have a history of exercise-induced asthma and has been using her inhaler she just feels like there is mucus stuck in her chest that she cannot move.  She says she just does not really feel any better.  Now that she is try to go back to work she feels like she is getting more heaviness in her chest and feels like it is almost going into her back.  Norcet no sore throat or ear pain.  No fevers chills or sweats.  She has been using her albuterol inhaler and she says it really only helps for couple of hours and then feels like it wears off.  She has been using Sudafed for the runny nose.  Using Mucinex as well for the cough.  She did have laryngitis initially but that seems to have gone away.  Past Medical History:  Diagnosis Date   Asthma    exercise induced   Elevated blood pressure reading 09/24/2018   Low iron    hx of during pregnancy    Menorrhagia    Sleep apnea    cpap     Past Surgical History:  Procedure Laterality Date   ABDOMINAL HYSTERECTOMY Bilateral 10/01/2018   Procedure: HYSTERECTOMY ABDOMINAL, bilateral salpingectomy;  Surgeon: Marcelle OverlieGrewal, Michelle, MD;  [facility];  Service: Gynecology;  Laterality: Bilateral;   BILATERAL SALPINGECTOMY Bilateral 10/01/2018   CESAREAN SECTION  12/21/1999   COLONOSCOPY  2019   TONSILLECTOMY  1990's   TOTAL ABDOMINAL HYSTERECTOMY  10/01/2018    Family History  Problem Relation Age of Onset   Stroke Other    Diabetes Father    Hypertension Father    Sleep apnea Father        morbidly obese.    Other Daughter        Leomyosarcoma- right bicep- in remission   Hypertension Mother    Thyroid disease Mother      Social History   Socioeconomic History   Marital status: Married    Spouse name: Not on file   Number of children: Not on file   Years of education: Not on file   Highest education level: Not on file  Occupational History   Not on file  Tobacco Use   Smoking status: Never   Smokeless tobacco: Never  Substance and Sexual Activity   Alcohol use: Yes    Comment: 1-2 glasses per week of bourbon , wine cooler,   Drug use: No   Sexual activity: Not on file  Other Topics Concern   Not on file  Social History Narrative   Not on file   Social Determinants of Health   Financial Resource Strain: Not on file  Food Insecurity: Not on file  Transportation Needs: Not on file  Physical Activity: Not on file  Stress: Not on file  Social Connections: Not on file  Intimate Partner Violence: Not on file    Outpatient Medications Prior to Visit  Medication Sig Dispense Refill   acetaminophen (TYLENOL) 500 MG tablet Take 500 mg by mouth daily as needed for moderate pain or headache.     albuterol (VENTOLIN  HFA) 108 (90 Base) MCG/ACT inhaler Inhale 2 puffs into the lungs every 6 (six) hours as needed for wheezing. 1 Inhaler 1   Ascorbic Acid (VITAMIN C) 1000 MG tablet Take 1,000 mg by mouth every evening.     Cholecalciferol (VITAMIN D) 50 MCG (2000 UT) tablet Take 2,000 Units by mouth every evening.     Coenzyme Q10 300 MG CAPS Take 300 mg by mouth 4 (four) times a week.     ibuprofen (ADVIL,MOTRIN) 200 MG tablet Take 200 mg by mouth daily as needed for headache or moderate pain.     nystatin-triamcinolone (MYCOLOG II) cream Apply 1 application topically 2 (two) times daily. (Patient taking differently: Apply 1 application topically daily as needed (fungus).) 30 g 1   oxybutynin (DITROPAN) 5 MG tablet Take 1 tablet (5 mg total) by mouth every 8 (eight) hours as needed for bladder spasms. 30 tablet 0   TURMERIC PO Take 2,000 mg by mouth every evening.     Diethylpropion HCl CR 75 MG TB24  Take 75 mg by mouth every evening.   3   benzonatate (TESSALON) 200 MG capsule Take 1 capsule (200 mg total) by mouth 3 (three) times daily as needed for cough. 45 capsule 0   azithromycin (ZITHROMAX) 250 MG tablet Take 1 tablet (250 mg total) by mouth daily. Take first 2 tablets together, then 1 every day until finished. 6 tablet 0   docusate sodium (COLACE) 100 MG capsule Take 1 capsule (100 mg total) by mouth 2 (two) times daily. 10 capsule 0   oxyCODONE (OXY IR/ROXICODONE) 5 MG immediate release tablet Take 1-2 tablets (5-10 mg total) by mouth every 4 (four) hours as needed for moderate pain. 30 tablet 0   predniSONE (DELTASONE) 10 MG tablet Take 3 tablets (30 mg total) by mouth daily with breakfast. 15 tablet 0   traMADol (ULTRAM) 50 MG tablet Take 1 tablet (50 mg total) by mouth every 6 (six) hours as needed (mild pain). 30 tablet 0   No facility-administered medications prior to visit.    No Known Allergies  Review of Systems     Objective:    Physical Exam Constitutional:      Appearance: She is well-developed.  HENT:     Head: Normocephalic and atraumatic.     Right Ear: External ear normal.     Left Ear: External ear normal.     Nose: Nose normal.  Eyes:     Conjunctiva/sclera: Conjunctivae normal.     Pupils: Pupils are equal, round, and reactive to light.  Neck:     Thyroid: No thyromegaly.  Cardiovascular:     Rate and Rhythm: Normal rate and regular rhythm.     Heart sounds: Normal heart sounds.  Pulmonary:     Effort: Pulmonary effort is normal.     Breath sounds: Normal breath sounds. No wheezing.  Musculoskeletal:     Cervical back: Neck supple.  Lymphadenopathy:     Cervical: No cervical adenopathy.  Skin:    General: Skin is warm and dry.  Neurological:     Mental Status: She is alert and oriented to person, place, and time.    BP (!) 162/84    Pulse 85    Temp 98.4 F (36.9 C)    Resp 18    Ht 5\' 3"  (1.6 m)    Wt 260 lb (117.9 kg)    LMP 09/28/2018  (Exact Date) Comment: hysterectomy 10/01/18   SpO2 96%  BMI 46.06 kg/m  Wt Readings from Last 3 Encounters:  10/23/21 260 lb (117.9 kg)  10/01/18 243 lb 1.6 oz (110.3 kg)  09/24/18 245 lb (111.1 kg)    Health Maintenance Due  Topic Date Due   Pneumococcal Vaccine 37-50 Years old (1 - PCV) Never done   HIV Screening  Never done   Hepatitis C Screening  Never done    There are no preventive care reminders to display for this patient.   Lab Results  Component Value Date   TSH 1.371 10/06/2009   Lab Results  Component Value Date   WBC 13.2 (H) 10/04/2018   HGB 10.0 (L) 10/04/2018   HCT 33.5 (L) 10/04/2018   MCV 79.6 (L) 10/04/2018   PLT 417 (H) 10/04/2018   Lab Results  Component Value Date   NA 136 10/04/2018   K 3.7 10/04/2018   CO2 26 10/04/2018   GLUCOSE 99 10/04/2018   BUN 6 10/04/2018   CREATININE 0.78 10/04/2018   BILITOT 0.5 08/25/2008   ALKPHOS 92 08/25/2008   AST 21 08/25/2008   ALT 24 08/25/2008   PROT 7.5 08/25/2008   ALBUMIN 4.3 08/25/2008   CALCIUM 8.2 (L) 10/04/2018   ANIONGAP 11 10/04/2018   Lab Results  Component Value Date   CHOL 160 08/25/2008   Lab Results  Component Value Date   HDL 45 08/25/2008   Lab Results  Component Value Date   LDLCALC 99 08/25/2008   Lab Results  Component Value Date   TRIG 80 08/25/2008   Lab Results  Component Value Date   CHOLHDL 3.6 Ratio 08/25/2008   No results found for: HGBA1C     Assessment & Plan:   Problem List Items Addressed This Visit   None Visit Diagnoses     Mild intermittent asthma with exacerbation    -  Primary   Relevant Medications   predniSONE (DELTASONE) 20 MG tablet   Lower respiratory infection       Relevant Medications   azithromycin (ZITHROMAX) 250 MG tablet      Asthma exacerbation-we will treat with oral prednisone.  I did give her a prescription for azithromycin to fill in the next couple days if she does not feel like she is improving and getting some relief  since this did start with an upper respiratory illness.  Call if not improving.  Make sure hydrating well.  Okay to continue taking her Mucinex since that does seem to help.  Meds ordered this encounter  Medications   predniSONE (DELTASONE) 20 MG tablet    Sig: Take 2 tablets (40 mg total) by mouth daily with breakfast.    Dispense:  10 tablet    Refill:  0   azithromycin (ZITHROMAX) 250 MG tablet    Sig: 2 Ttabs PO on Day 1, then one a day x 4 days.    Dispense:  6 tablet    Refill:  0     Nani Gasser, MD

## 2021-10-23 NOTE — Progress Notes (Signed)
Pap Smear done 12/2020 with Dr. [facility].

## 2022-10-31 ENCOUNTER — Ambulatory Visit: Payer: 59 | Admitting: Family Medicine

## 2022-10-31 ENCOUNTER — Encounter: Payer: Self-pay | Admitting: Family Medicine

## 2022-10-31 ENCOUNTER — Ambulatory Visit (INDEPENDENT_AMBULATORY_CARE_PROVIDER_SITE_OTHER): Payer: 59

## 2022-10-31 VITALS — BP 145/86 | HR 81 | Ht 63.0 in | Wt 248.0 lb

## 2022-10-31 DIAGNOSIS — J019 Acute sinusitis, unspecified: Secondary | ICD-10-CM

## 2022-10-31 DIAGNOSIS — J4521 Mild intermittent asthma with (acute) exacerbation: Secondary | ICD-10-CM | POA: Diagnosis not present

## 2022-10-31 DIAGNOSIS — R87619 Unspecified abnormal cytological findings in specimens from cervix uteri: Secondary | ICD-10-CM | POA: Insufficient documentation

## 2022-10-31 DIAGNOSIS — R0602 Shortness of breath: Secondary | ICD-10-CM | POA: Diagnosis not present

## 2022-10-31 DIAGNOSIS — R053 Chronic cough: Secondary | ICD-10-CM | POA: Diagnosis not present

## 2022-10-31 MED ORDER — AZITHROMYCIN 250 MG PO TABS: ORAL_TABLET | ORAL | 0 refills | Status: AC

## 2022-10-31 NOTE — Progress Notes (Signed)
Acute Office Visit  Subjective:     Patient ID: Jordan Hickman, female    DOB: Jul 27, 1976, 47 y.o.   MRN: [id number]  Chief Complaint  Patient presents with   Respiratory Distress    HPI Patient is in today for shortness of breath.  History of asthma.  Times originally started back in April where she noticed a change in her breathing.  She was evaluated by [facility] back in October for the symptoms and they did a panel of blood work.  At the time her white blood cell count was 7.8.  Hemoglobin was normal at 13.6.  She had a lower level of absolute CD8-CD5 7+ lymphocytes.  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  CRP was mildly elevated at 15.  Negative thyroglobulin antibodies.  Slightly elevated leptin.  She came in today because she was experiencing upper respiratory symptoms from December 22 until just January 3.  She started to feel much better on her own until yesterday on the 10th where her cough got worse and she felt very fatigued and more short of breath.  She says it feels similar to the symptoms that she had the week prior.  So she feels like she was starting to get worse again.  Uses her inhaler and helps some.  Using nyquail and mucinex.  No sore throat, no ear pain.  She has had some persistent drainage and nasal congestion.  In fact she has been taking Sudafed and Mucinex and NyQuil.    He has a goal to work on weight loss to help with her breathing.  ROS      Objective:    BP (!) 145/86   Pulse 81   Ht 5\' 3"  (1.6 m)   Wt 248 lb (112.5 kg)   LMP 09/28/2018 (Exact Date) Comment: hysterectomy 10/01/18  SpO2 96%   BMI 43.93 kg/m    Physical Exam Vitals and nursing note reviewed.  Constitutional:      Appearance: She is well-developed.  HENT:     Head: Normocephalic and atraumatic.  Cardiovascular:     Rate and Rhythm: Normal rate and regular rhythm.     Heart sounds: Normal heart sounds.  Pulmonary:      Effort: Pulmonary effort is normal.     Breath sounds: Normal breath sounds.  Skin:    General: Skin is warm and dry.  Neurological:     Mental Status: She is alert and oriented to person, place, and time.  Psychiatric:        Behavior: Behavior normal.     No results found for any visits on 10/31/22.      Assessment & Plan:   Problem List Items Addressed This Visit   None Visit Diagnoses     Chronic cough    -  Primary   Relevant Orders   DG Chest 2 View   Pulmonary function test   SOB (shortness of breath)       Relevant Orders   DG Chest 2 View   Pulmonary function test   Acute non-recurrent sinusitis, unspecified location       Relevant Medications   azithromycin (ZITHROMAX) 250 MG tablet   Mild intermittent asthma with exacerbation       Relevant Orders   Pulmonary function test      Acute sinusitis - likley viral with now secondary bacterial infection. Will tx with zpack.    Longer term SOB x 9  months - rec CXR and PFTs.  May benefit from a controller asthma medication.  Consider viral trigger.  Hx of chemical exposure years ago.    Meds ordered this encounter  Medications   azithromycin (ZITHROMAX) 250 MG tablet    Sig: 2 Ttabs PO on Day 1, then one a day x 4 days.    Dispense:  6 tablet    Refill:  0    No follow-ups on file.  Beatrice Lecher, MD

## 2022-11-01 NOTE — Progress Notes (Signed)
Call patient: Jordan Hickman news!  Chest x-ray is normal.  No sign of pneumonia which is reassuring.

## 2023-06-30 ENCOUNTER — Encounter: Payer: Self-pay | Admitting: Physician Assistant

## 2023-06-30 ENCOUNTER — Ambulatory Visit (INDEPENDENT_AMBULATORY_CARE_PROVIDER_SITE_OTHER): Payer: 59 | Admitting: Physician Assistant

## 2023-06-30 VITALS — BP 138/92 | HR 86 | Ht 63.0 in | Wt 249.0 lb

## 2023-06-30 DIAGNOSIS — Z13220 Encounter for screening for lipoid disorders: Secondary | ICD-10-CM

## 2023-06-30 DIAGNOSIS — Z6841 Body Mass Index (BMI) 40.0 and over, adult: Secondary | ICD-10-CM

## 2023-06-30 DIAGNOSIS — Z79890 Hormone replacement therapy (postmenopausal): Secondary | ICD-10-CM | POA: Insufficient documentation

## 2023-06-30 DIAGNOSIS — Z131 Encounter for screening for diabetes mellitus: Secondary | ICD-10-CM | POA: Diagnosis not present

## 2023-06-30 DIAGNOSIS — Z0000 Encounter for general adult medical examination without abnormal findings: Secondary | ICD-10-CM

## 2023-06-30 DIAGNOSIS — Z79899 Other long term (current) drug therapy: Secondary | ICD-10-CM | POA: Diagnosis not present

## 2023-06-30 DIAGNOSIS — E569 Vitamin deficiency, unspecified: Secondary | ICD-10-CM

## 2023-06-30 DIAGNOSIS — E66813 Obesity, class 3: Secondary | ICD-10-CM

## 2023-06-30 NOTE — Patient Instructions (Signed)

## 2023-06-30 NOTE — Progress Notes (Signed)
Complete physical exam  Patient: Jordan Hickman   DOB: 1976/08/10   47 y.o. Female  MRN: [id number]  Subjective:    Chief Complaint  Patient presents with   Annual Exam    Jordan Hickman is a 47 y.o. female who presents today for a complete physical exam. She reports consuming a general diet.  She is exercising about once or twice a week.   She generally feels well. She reports sleeping well. She does not have additional problems to discuss today.    Most recent fall risk assessment:    10/31/2022   11:01 AM  Fall Risk   Falls in the past year? 0  Number falls in past yr: 0  Injury with Fall? 0  Risk for fall due to : No Fall Risks  Follow up Falls evaluation completed     Most recent depression screenings:    06/30/2023    8:33 AM 10/31/2022   11:00 AM  PHQ 2/9 Scores  PHQ - 2 Score 0 0    Vision:Within last year and Dental: No current dental problems and Receives regular dental care  Patient Active Problem List   Diagnosis Date Noted   Abnormal cervical Papanicolaou smear 10/31/2022   Seasonal allergies 02/21/2021   Pelvic pain 10/01/2018   S/P TAH (total abdominal hysterectomy) 10/01/2018   Sleep apnea 08/20/2011   EXCESSIVE OR FREQUENT MENSTRUATION 10/06/2009   WEIGHT GAIN 10/06/2009   ASTHMA, EXERCISE INDUCED 11/28/2008   Past Medical History:  Diagnosis Date   Asthma    exercise induced   Elevated blood pressure reading 09/24/2018   Low iron    hx of during pregnancy    Menorrhagia    Sleep apnea    cpap    Past Surgical History:  Procedure Laterality Date   ABDOMINAL HYSTERECTOMY Bilateral 10/01/2018   Procedure: HYSTERECTOMY ABDOMINAL, bilateral salpingectomy;  Surgeon: Marcelle Overlie, MD;  Location: [facility];  Service: Gynecology;  Laterality: Bilateral;   BILATERAL SALPINGECTOMY Bilateral 10/01/2018   CESAREAN SECTION  12/21/1999   COLONOSCOPY  2019   TONSILLECTOMY  1990's   TOTAL ABDOMINAL HYSTERECTOMY  10/01/2018    Family Status  Relation Name Status   Other GF Deceased at age 53   Father  (Not Specified)   Daughter  (Not Specified)   Mother  (Not Specified)  No partnership data on file   Family History  Problem Relation Age of Onset   Stroke Other    Diabetes Father    Hypertension Father    Sleep apnea Father        morbidly obese.    Other Daughter        Leomyosarcoma- right bicep- in remission   Hypertension Mother    Thyroid disease Mother    No Known Allergies    Patient Care Team: Agapito Games, MD as PCP - General   Outpatient Medications Prior to Visit  Medication Sig   albuterol (VENTOLIN HFA) 108 (90 Base) MCG/ACT inhaler Inhale 2 puffs into the lungs every 6 (six) hours as needed for wheezing.   Ascorbic Acid (VITAMIN C) 1000 MG tablet Take 1,000 mg by mouth every evening.   Barberry-Oreg Grape-Goldenseal (BERBERINE COMPLEX PO) Take by mouth.   Cholecalciferol (VITAMIN D) 50 MCG (2000 UT) tablet Take 2,000 Units by mouth every evening.   Coenzyme Q10 300 MG CAPS Take 300 mg by mouth 4 (four) times a week.   COLLAGEN PO Take by mouth.   Glucosamine  HCl (GLUCOSAMINE PO) Take by mouth.   L-THEANINE PO Take by mouth.   MAGNESIUM PO Take by mouth.   nystatin-triamcinolone (MYCOLOG II) cream Apply 1 application topically 2 (two) times daily. (Patient taking differently: Apply 1 application  topically daily as needed (fungus).)   Omega-3 Fatty Acids (FISH OIL PO) Take by mouth.   Prasterone, DHEA, (DHEA PO) Take by mouth.   TURMERIC PO Take 2,000 mg by mouth every evening.   acetaminophen (TYLENOL) 500 MG tablet Take 500 mg by mouth daily as needed for moderate pain or headache. (Patient not taking: Reported on 06/30/2023)   No facility-administered medications prior to visit.    ROS  See HPI.       Objective:     BP (!) 138/92 (BP Location: Right Arm)   Pulse 86   Ht 5\' 3"  (1.6 m)   Wt 249 lb (112.9 kg)   LMP 09/28/2018 (Exact Date) Comment:  hysterectomy 10/01/18  SpO2 97%   BMI 44.11 kg/m  BP Readings from Last 3 Encounters:  06/30/23 (!) 138/92  10/31/22 (!) 145/86  10/23/21 (!) 162/84   Wt Readings from Last 3 Encounters:  06/30/23 249 lb (112.9 kg)  10/31/22 248 lb (112.5 kg)  10/23/21 260 lb (117.9 kg)    ..    06/30/2023    8:33 AM 10/31/2022   11:00 AM 10/23/2021    4:50 PM  Depression screen PHQ 2/9  Decreased Interest 0 0 0  Down, Depressed, Hopeless 0 0 0  PHQ - 2 Score 0 0 0     Physical Exam  BP (!) 138/92 (BP Location: Right Arm)   Pulse 86   Ht 5\' 3"  (1.6 m)   Wt 249 lb (112.9 kg)   LMP 09/28/2018 (Exact Date) Comment: hysterectomy 10/01/18  SpO2 97%   BMI 44.11 kg/m   General Appearance:    Alert, cooperative, obese no distress, appears stated age  Head:    Normocephalic, without obvious abnormality, atraumatic  Eyes:    PERRL, conjunctiva/corneas clear, EOM's intact, fundi    benign, both eyes  Ears:    Normal TM's and external ear canals, both ears  Nose:   Nares normal, septum midline, mucosa normal, no drainage    or sinus tenderness  Throat:   Lips, mucosa, and tongue normal; teeth and gums normal  Neck:   Supple, symmetrical, trachea midline, no adenopathy;    thyroid:  no enlargement/tenderness/nodules; no carotid   bruit or JVD  Back:     Symmetric, no curvature, ROM normal, no CVA tenderness  Lungs:     Clear to auscultation bilaterally, respirations unlabored  Chest Wall:    No tenderness or deformity   Heart:    Regular rate and rhythm, S1 and S2 normal, no murmur, rub   or gallop     Abdomen:     Soft, non-tender, bowel sounds active all four quadrants,    no masses, no organomegaly        Extremities:   Extremities normal, atraumatic, no cyanosis or edema  Pulses:   2+ and symmetric all extremities  Skin:   Skin color, texture, turgor normal, no rashes or lesions  Lymph nodes:   Cervical, supraclavicular, and axillary nodes normal  Neurologic:   CNII-XII intact, normal  strength, sensation and reflexes    throughout      Assessment & Plan:    Routine Health Maintenance and Physical Exam   There is no immunization history on file for  this patient.  Health Maintenance  Topic Date Due   DTaP/Tdap/Td (1 - Tdap) Never done   COVID-19 Vaccine (1) 07/16/2023 (Originally 04/17/1981)   Hepatitis C Screening  11/01/2023 (Originally 04/17/1994)   HIV Screening  11/01/2023 (Originally 04/18/1991)   INFLUENZA VACCINE  01/19/2024 (Originally 05/22/2023)   MAMMOGRAM  06/29/2024 (Originally 07/29/2011)   Colonoscopy  07/02/2028   Pneumococcal Vaccine 82-13 Years old  Aged Out   HPV VACCINES  Aged Out    Discussed health benefits of physical activity, and encouraged her to engage in regular exercise appropriate for her age and condition.  Marland KitchenSedalia Muta was seen today for annual exam.  Diagnoses and all orders for this visit:  Routine physical examination -     CBC w/Diff/Platelet -     TSH -     CMP14+EGFR -     Lipid panel  Screening for diabetes mellitus -     CMP14+EGFR  Screening for lipid disorders -     Lipid panel  Medication management -     VITAMIN D 25 Hydroxy (Vit-D Deficiency, Fractures) -     Estradiol -     Progesterone  Vitamin deficiency -     VITAMIN D 25 Hydroxy (Vit-D Deficiency, Fractures)   Discussed 150 minutes of exercise a week.  Encouraged vitamin D 1000 units and Calcium 1300mg  or 4 servings of dairy a day.  Fasting labs ordered today Declined mammogram and aware of risk Pap not indicated On HRT-recheck estradiol and progesterone today Colonoscopy UTD BP not to goal did go down some on 2nd recheck Encouraged regular exercise and low salt diet, recheck in 3 months with PCP Declined all vaccines pt aware of risk    Tandy Gaw, PA-C

## 2023-07-01 LAB — LIPID PANEL
Chol/HDL Ratio: 4.2 ratio (ref 0.0–4.4)
Cholesterol, Total: 171 mg/dL (ref 100–199)
HDL: 41 mg/dL (ref >39)
LDL Chol Calc (NIH): 113 mg/dL — ABNORMAL HIGH (ref 0–99)
Triglycerides: 93 mg/dL (ref 0–149)
VLDL Cholesterol Cal: 17 mg/dL (ref 5–40)

## 2023-07-01 LAB — CMP14+EGFR
ALT: 16 IU/L (ref 0–32)
AST: 15 IU/L (ref 0–40)
Albumin: 4.3 g/dL (ref 3.9–4.9)
Alkaline Phosphatase: 95 IU/L (ref 44–121)
BUN/Creatinine Ratio: 15 (ref 9–23)
BUN: 10 mg/dL (ref 6–24)
Bilirubin Total: 0.5 mg/dL (ref 0.0–1.2)
CO2: 21 mmol/L (ref 20–29)
Calcium: 9.3 mg/dL (ref 8.7–10.2)
Chloride: 102 mmol/L (ref 96–106)
Creatinine, Ser: 0.65 mg/dL (ref 0.57–1.00)
Globulin, Total: 2.5 g/dL (ref 1.5–4.5)
Glucose: 94 mg/dL (ref 70–99)
Potassium: 4.4 mmol/L (ref 3.5–5.2)
Sodium: 140 mmol/L (ref 134–144)
Total Protein: 6.8 g/dL (ref 6.0–8.5)
eGFR: 109 mL/min/{1.73_m2} (ref >59)

## 2023-07-01 LAB — CBC WITH DIFFERENTIAL/PLATELET
Basophils Absolute: 0.1 10*3/uL (ref 0.0–0.2)
Basos: 1 %
EOS (ABSOLUTE): 0.1 10*3/uL (ref 0.0–0.4)
Eos: 1 %
Hematocrit: 40.9 % (ref 34.0–46.6)
Hemoglobin: 13.3 g/dL (ref 11.1–15.9)
Immature Grans (Abs): 0 10*3/uL (ref 0.0–0.1)
Immature Granulocytes: 0 %
Lymphocytes Absolute: 1.9 10*3/uL (ref 0.7–3.1)
Lymphs: 24 %
MCH: 27.7 pg (ref 26.6–33.0)
MCHC: 32.5 g/dL (ref 31.5–35.7)
MCV: 85 fL (ref 79–97)
Monocytes Absolute: 0.3 10*3/uL (ref 0.1–0.9)
Monocytes: 3 %
Neutrophils Absolute: 5.7 10*3/uL (ref 1.4–7.0)
Neutrophils: 71 %
Platelets: 336 10*3/uL (ref 150–450)
RBC: 4.81 x10E6/uL (ref 3.77–5.28)
RDW: 12.4 % (ref 11.7–15.4)
WBC: 8 10*3/uL (ref 3.4–10.8)

## 2023-07-01 LAB — ESTRADIOL: Estradiol: 230 pg/mL

## 2023-07-01 LAB — VITAMIN D 25 HYDROXY (VIT D DEFICIENCY, FRACTURES): Vit D, 25-Hydroxy: 43.5 ng/mL (ref 30.0–100.0)

## 2023-07-01 LAB — TSH: TSH: 1.22 u[IU]/mL (ref 0.450–4.500)

## 2023-07-01 LAB — PROGESTERONE: Progesterone: 2.3 ng/mL

## 2023-07-01 NOTE — Progress Notes (Signed)
Jordan Hickman,   Hemoglobin 13.3 and looks great.  Thyroid perfect.  Glucose, kidney and liver look wonderful.  Vitamin D normal.  Estrogen and progesterone in normal ranges.   10 year CV risk is low and good. Continue to work on regular exercise!   Marland KitchenMarland KitchenThe 10-year ASCVD risk score (Arnett DK, et al., 2019) is: 1.3%   Values used to calculate the score:     Age: 47 years     Sex: Female     Is Non-Hispanic African American: No     Diabetic: No     Tobacco smoker: No     Systolic Blood Pressure: 138 mmHg     Is BP treated: No     HDL Cholesterol: 41 mg/dL     Total Cholesterol: 171 mg/dL

## 2024-06-01 ENCOUNTER — Telehealth: Payer: Self-pay

## 2024-06-01 NOTE — Telephone Encounter (Signed)
 Jordan Hickman didn't want to schedule a mammogram at this time. She did want to schedule a physical.
